# Patient Record
Sex: Female | Born: 1970
Health system: Southern US, Community
[De-identification: ages and names within clinical notes are randomized; demographics above are authoritative.]

## PROBLEM LIST (undated history)

## (undated) DIAGNOSIS — F419 Anxiety disorder, unspecified: Secondary | ICD-10-CM

## (undated) DIAGNOSIS — D649 Anemia, unspecified: Secondary | ICD-10-CM

## (undated) HISTORY — DX: Anxiety disorder, unspecified: F41.9

## (undated) HISTORY — DX: Anemia, unspecified: D64.9

## (undated) HISTORY — PX: TUBAL LIGATION: SHX77

---

## 1999-02-20 ENCOUNTER — Encounter: Payer: Self-pay | Admitting: Emergency Medicine

## 1999-02-20 ENCOUNTER — Emergency Department (HOSPITAL_COMMUNITY): Admission: EM | Admit: 1999-02-20 | Discharge: 1999-02-20 | Payer: Self-pay | Admitting: Emergency Medicine

## 2000-08-27 ENCOUNTER — Encounter: Admission: RE | Admit: 2000-08-27 | Discharge: 2000-08-27 | Payer: Self-pay | Admitting: Family Medicine

## 2000-09-20 ENCOUNTER — Encounter: Admission: RE | Admit: 2000-09-20 | Discharge: 2000-09-20 | Payer: Self-pay | Admitting: Family Medicine

## 2000-09-20 ENCOUNTER — Other Ambulatory Visit: Admission: RE | Admit: 2000-09-20 | Discharge: 2000-09-20 | Payer: Self-pay | Admitting: *Deleted

## 2000-09-27 ENCOUNTER — Ambulatory Visit (HOSPITAL_COMMUNITY): Admission: RE | Admit: 2000-09-27 | Discharge: 2000-09-27 | Payer: Self-pay | Admitting: Family Medicine

## 2000-10-04 ENCOUNTER — Encounter: Admission: RE | Admit: 2000-10-04 | Discharge: 2000-10-04 | Payer: Self-pay | Admitting: Family Medicine

## 2000-12-06 ENCOUNTER — Encounter: Admission: RE | Admit: 2000-12-06 | Discharge: 2000-12-06 | Payer: Self-pay | Admitting: Family Medicine

## 2000-12-07 ENCOUNTER — Encounter (HOSPITAL_COMMUNITY): Admission: RE | Admit: 2000-12-07 | Discharge: 2001-01-25 | Payer: Self-pay | Admitting: Obstetrics & Gynecology

## 2000-12-08 ENCOUNTER — Encounter: Admission: RE | Admit: 2000-12-08 | Discharge: 2000-12-08 | Payer: Self-pay | Admitting: Family Medicine

## 2000-12-09 ENCOUNTER — Encounter: Admission: RE | Admit: 2000-12-09 | Discharge: 2000-12-09 | Payer: Self-pay | Admitting: Family Medicine

## 2000-12-13 ENCOUNTER — Encounter: Admission: RE | Admit: 2000-12-13 | Discharge: 2000-12-13 | Payer: Self-pay | Admitting: Pediatrics

## 2000-12-28 ENCOUNTER — Encounter: Admission: RE | Admit: 2000-12-28 | Discharge: 2000-12-28 | Payer: Self-pay | Admitting: Family Medicine

## 2001-01-06 ENCOUNTER — Inpatient Hospital Stay (HOSPITAL_COMMUNITY): Admission: AD | Admit: 2001-01-06 | Discharge: 2001-01-06 | Payer: Self-pay | Admitting: *Deleted

## 2001-01-12 ENCOUNTER — Encounter: Admission: RE | Admit: 2001-01-12 | Discharge: 2001-01-12 | Payer: Self-pay | Admitting: Family Medicine

## 2001-01-18 ENCOUNTER — Encounter: Admission: RE | Admit: 2001-01-18 | Discharge: 2001-01-18 | Payer: Self-pay | Admitting: Family Medicine

## 2001-01-24 ENCOUNTER — Inpatient Hospital Stay (HOSPITAL_COMMUNITY): Admission: AD | Admit: 2001-01-24 | Discharge: 2001-01-24 | Payer: Self-pay | Admitting: Obstetrics & Gynecology

## 2001-01-24 ENCOUNTER — Inpatient Hospital Stay (HOSPITAL_COMMUNITY): Admission: AD | Admit: 2001-01-24 | Discharge: 2001-01-26 | Payer: Self-pay | Admitting: Obstetrics

## 2002-03-13 ENCOUNTER — Encounter: Payer: Self-pay | Admitting: Obstetrics and Gynecology

## 2002-03-13 ENCOUNTER — Inpatient Hospital Stay (HOSPITAL_COMMUNITY): Admission: AD | Admit: 2002-03-13 | Discharge: 2002-03-13 | Payer: Self-pay | Admitting: Obstetrics and Gynecology

## 2002-09-05 ENCOUNTER — Other Ambulatory Visit: Admission: RE | Admit: 2002-09-05 | Discharge: 2002-09-05 | Payer: Self-pay | Admitting: *Deleted

## 2002-10-26 ENCOUNTER — Inpatient Hospital Stay (HOSPITAL_COMMUNITY): Admission: AD | Admit: 2002-10-26 | Discharge: 2002-10-28 | Payer: Self-pay | Admitting: *Deleted

## 2002-10-27 ENCOUNTER — Encounter (INDEPENDENT_AMBULATORY_CARE_PROVIDER_SITE_OTHER): Payer: Self-pay | Admitting: Specialist

## 2003-04-23 ENCOUNTER — Emergency Department (HOSPITAL_COMMUNITY): Admission: EM | Admit: 2003-04-23 | Discharge: 2003-04-23 | Payer: Self-pay | Admitting: Emergency Medicine

## 2003-08-09 ENCOUNTER — Emergency Department (HOSPITAL_COMMUNITY): Admission: AD | Admit: 2003-08-09 | Discharge: 2003-08-09 | Payer: Self-pay | Admitting: Emergency Medicine

## 2003-08-09 ENCOUNTER — Encounter: Payer: Self-pay | Admitting: Emergency Medicine

## 2004-08-31 ENCOUNTER — Emergency Department (HOSPITAL_COMMUNITY): Admission: EM | Admit: 2004-08-31 | Discharge: 2004-09-01 | Payer: Self-pay | Admitting: Emergency Medicine

## 2005-01-05 IMAGING — CR DG CHEST 2V
2 series · 2 of 2 positions shown · non-contrast
Comparison: none

CLINICAL DATA: Acute onset chest pain.

CHEST - 2 VIEW

[view not recorded (1 of 2)]
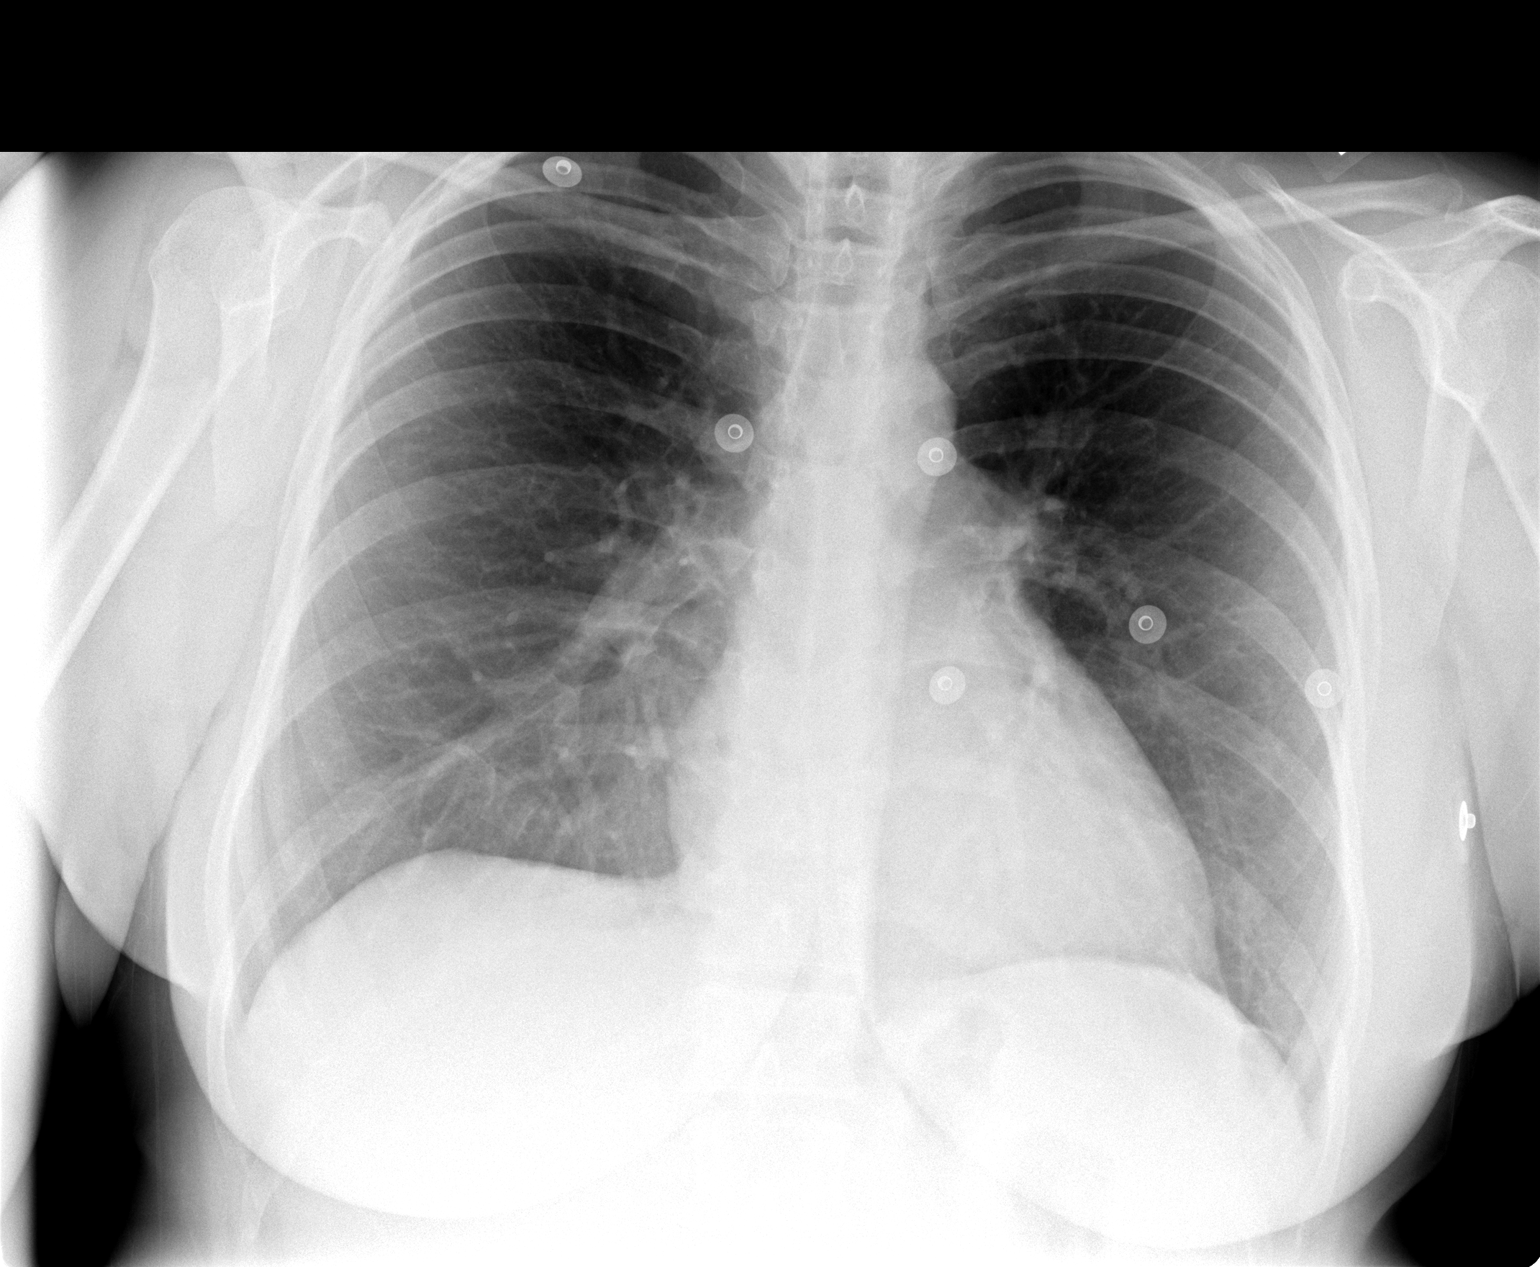

[view not recorded (2 of 2)]
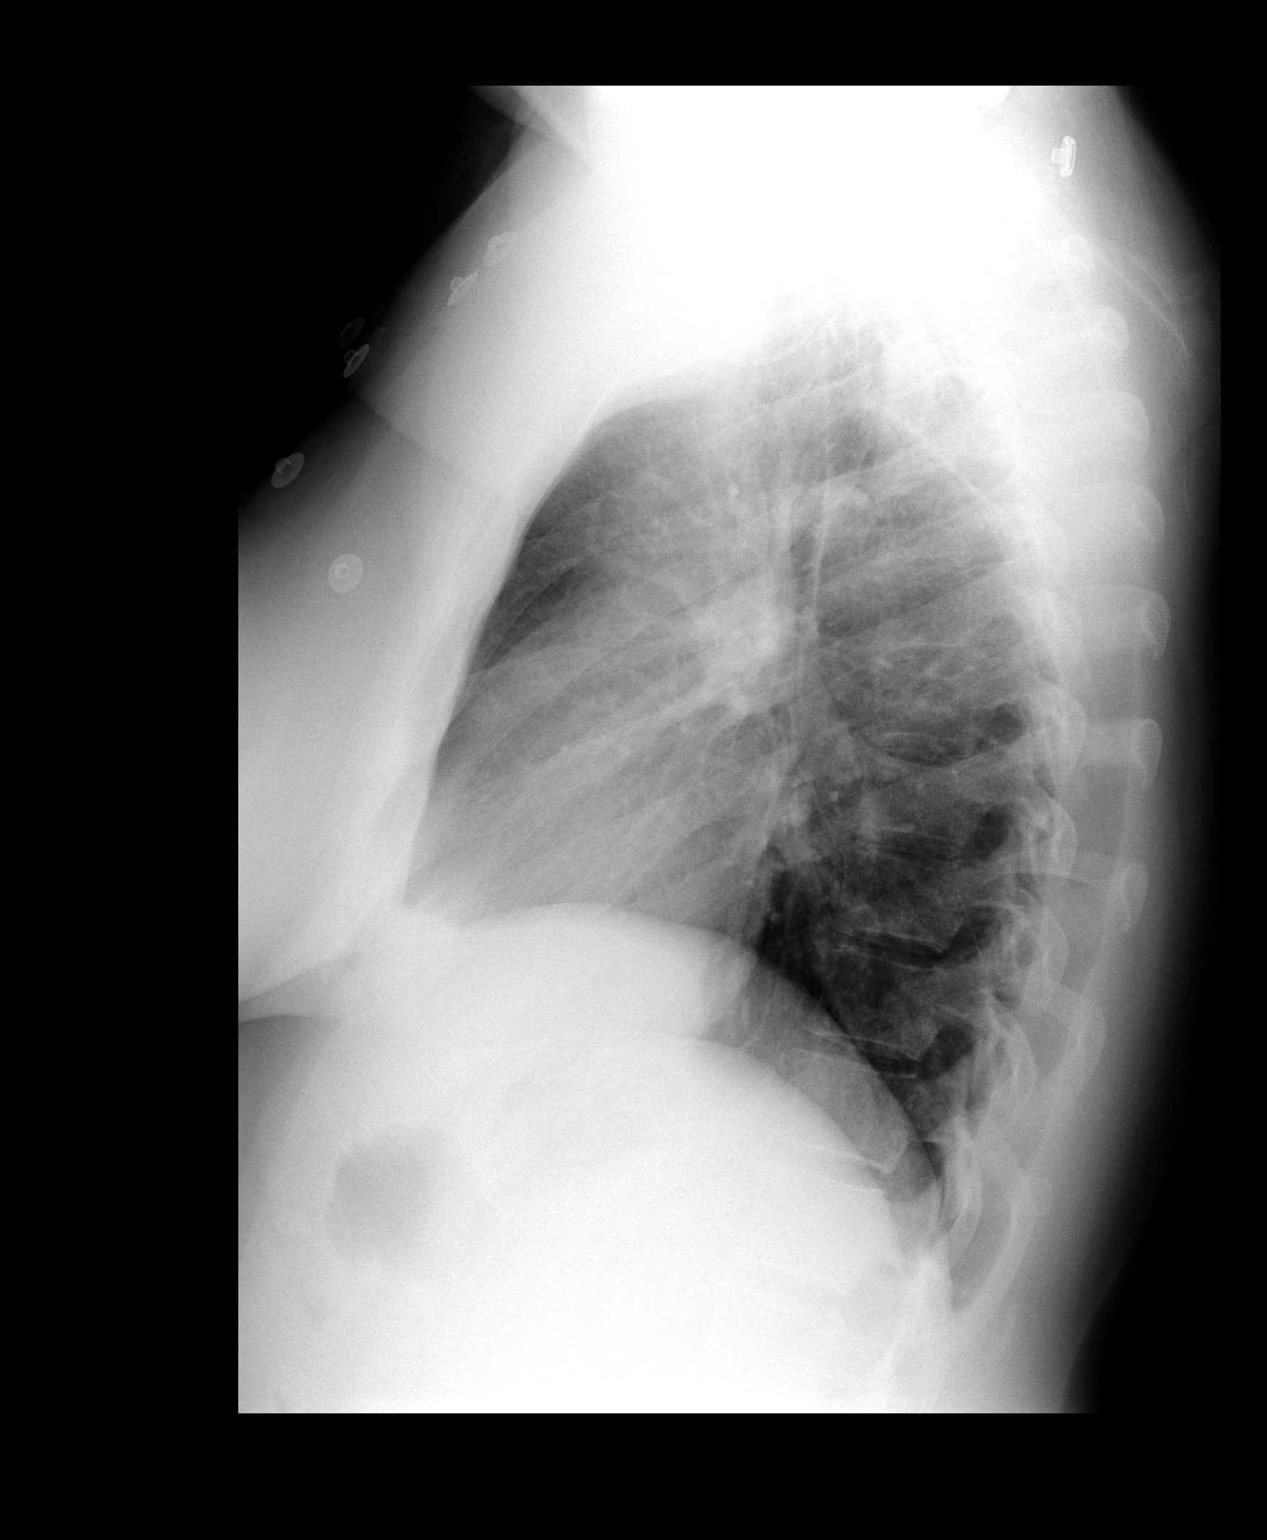

[2 of 2 positions shown; findings below may reference images not displayed]

FINDINGS: The heart size and mediastinal contours are within normal limits.  Both lungs are clear.
The visualized skeletal structures are unremarkable.

IMPRESSION

No active cardiopulmonary disease.

## 2010-11-30 ENCOUNTER — Encounter: Payer: Self-pay | Admitting: Neurology

## 2011-03-27 NOTE — Op Note (Signed)
   NAMESERINITY, WARE                           ACCOUNT NO.:  [id number]   MEDICAL RECORD NO.:  [id number]                   PATIENT TYPE:  INP   LOCATION:  9131                                 FACILITY:  [facility]   PHYSICIAN:  Georgina Peer, M.D.              DATE OF BIRTH:  [birth year]   DATE OF PROCEDURE:  10/27/2002  DATE OF DISCHARGE:  10/28/2002                                 OPERATIVE REPORT   PREOPERATIVE DIAGNOSES:  Postpartum.  Desires elective sterilization.   POSTOPERATIVE DIAGNOSES:  Postpartum.  Desires elective sterilization.   OPERATION PERFORMED:  Postpartum tubal ligation, Pomeroy method.   SURGEON:  Georgina Peer, M.D.   ANESTHESIA:  Epidural plus 10 cc of 0.25% Marcaine in the skin.   BLOOD LOSS:  Less than 20 cc.   FINDINGS:  Normal tubes.   COMPLICATIONS:  None.   INDICATIONS FOR PROCEDURE:  This 40 year old gravida 3, para 3, delivered on  October 26, 2002.  Desired permanent sterilization. She was aware of the  risks and complications of the procedure including bleeding, infection,  intra-abdominal injury, and the failure rate of 3 per 1000 risk.  She is  willing to proceed.  She had executed a consent form for tubal  sterilization.   DESCRIPTION OF PROCEDURE:  The patient had an epidural redosed and was  prepped and draped in a normal sterile fashion.  A subumbilical incision was  made.  The fascia was identified and divided.  The peritoneum was identified  and divided.  The right tube was identified and traced to its fimbriated  end.  A loop was elevated and a knuckle of tube tied with two absorbable  sutures and the midportion excised.  The left tube similarly was identified  and traced to its fimbriated end.  A knuckle in the midportion was elevated.  Two absorbable sutures were tied around this, and the midportion was  excised.  There was no other pathology noted and a limited evaluation.  The  fascia was closed with a Vicryl suture and the  skin with subcuticular Dexon.  Sponge, needle, and instrument counts were correct.  10 cc of 0.25% Marcaine  were injected into the incisional site.  The patient was returned to the  recovery area in a stable condition.                                               Georgina Peer, M.D.    JPN/MEDQ  D:  10/27/2002  T:  10/28/2002  Job:  [id number]

## 2015-12-14 ENCOUNTER — Emergency Department (HOSPITAL_COMMUNITY)
Admission: EM | Admit: 2015-12-14 | Discharge: 2015-12-14 | Disposition: A | Discharge status: Home / Self Care | Payer: Self-pay | Attending: Emergency Medicine | Admitting: Emergency Medicine

## 2015-12-14 ENCOUNTER — Emergency Department (HOSPITAL_COMMUNITY): Payer: Self-pay

## 2015-12-14 ENCOUNTER — Encounter (HOSPITAL_COMMUNITY): Payer: Self-pay | Admitting: Emergency Medicine

## 2015-12-14 DIAGNOSIS — J069 Acute upper respiratory infection, unspecified: Secondary | ICD-10-CM | POA: Insufficient documentation

## 2015-12-14 MED ORDER — GUAIFENESIN 100 MG/5ML PO LIQD: 100.0000 mg | ORAL | Status: DC | PRN

## 2015-12-14 MED ORDER — BENZONATATE 100 MG PO CAPS
200.0000 mg | ORAL_CAPSULE | Freq: Once | ORAL | Status: AC
  Administered 2015-12-14: 200 mg via ORAL
  Filled 2015-12-14: qty 2

## 2015-12-14 MED ORDER — BENZONATATE 100 MG PO CAPS: 200.0000 mg | ORAL_CAPSULE | Freq: Two times a day (BID) | ORAL | Status: DC | PRN

## 2015-12-14 NOTE — ED Provider Notes (Signed)
CSN: [id number]     Arrival date & time 12/14/15  4098 History   First MD Initiated Contact with Patient 12/14/15 [phone]     Chief Complaint  Patient presents with  . URI  . Sore Throat     (Consider location/radiation/quality/duration/timing/severity/associated sxs/prior Treatment) HPI    Erika Lang is a 45 year old female, otherwise healthy, who presents to the emergency room with complaints of cough for 6 days that has gradually worsened, began as throat irritation and has become intermittently productive with yellow sputum. Patient also complains of a sore throat. She has associated sweats which she states usually happen after she takes TheraFlu and ibuprofen. She is unsure if she has had any fever but denies night sweats, shortness of breath, chest tightness, wheeze, orthopnea, inspirational chest pain, lower extremity edema, palpitations.  She has had a few sick contacts at work. She reports being able to go to work all week and feeling relatively well when ibuprofen was working and worse when it wore off. She reports being able to sleep with her cough and having worsening cough upon wakening.  Denies hemoptysis, recent travel. She is a nonsmoker, no past pulmonary history.  She denies any nasal symptoms however has had a fullness in both of her ears.  She states that today she went in to work and felt a little jittery.  She denies lightheadedness, vertigo, shortness of breath, headache, fever, syncope.   History reviewed. No pertinent past medical history. History reviewed. No pertinent past surgical history. No family history on file. Social History  Substance Use Topics  . Smoking status: Never Smoker   . Smokeless tobacco: None  . Alcohol Use: No   OB History    No data available     Review of Systems  Constitutional: Negative for activity change and appetite change.  Eyes: Negative.   Respiratory: Negative for apnea, choking, chest tightness, shortness of breath, wheezing  and stridor.   Cardiovascular: Negative for chest pain, palpitations and leg swelling.  Gastrointestinal: Negative.  Negative for nausea, vomiting and abdominal pain.  Genitourinary: Negative.   Musculoskeletal: Negative.   Skin: Negative.  Negative for rash.  Neurological: Negative.       Allergies  Review of patient's allergies indicates not on file.  Home Medications   Prior to Admission medications   Medication Sig Start Date End Date Taking? Authorizing Provider  DM-Doxylamine-Acetaminophen (NYQUIL COLD & FLU PO) Take 1 capsule by mouth at bedtime as needed (flu symptoms).   Yes Historical Provider, MD  ibuprofen (ADVIL,MOTRIN) 200 MG tablet Take 200 mg by mouth every 6 (six) hours as needed for moderate pain.   Yes Historical Provider, MD  Phenylephrine-Pheniramine-DM [facility] COLD & COUGH) 08-29-19 MG PACK Take 1 packet by mouth every 8 (eight) hours as needed (cold and flu).   Yes Historical Provider, MD  benzonatate (TESSALON) 100 MG capsule Take 2 capsules (200 mg total) by mouth 2 (two) times daily as needed for cough. 12/14/15   Danelle Berry, PA-C  guaiFENesin (ROBITUSSIN) 100 MG/5ML liquid Take 5-10 mLs (100-200 mg total) by mouth every 4 (four) hours as needed for cough. 12/14/15   Danelle Berry, PA-C   BP 125/78 mmHg  Pulse 76  Temp(Src) 97 F (36.1 C) (Oral)  Resp 18  Ht  (1.626 m)  Wt 86.183 kg  BMI 32.60 kg/m2  SpO2 98%  LMP 12/11/2015 (Exact Date) Physical Exam  Constitutional: She is oriented to person, place, and time. She appears well-developed and well-nourished.  No distress.  Well-appearing female, appears stated age, NAD  HENT:  Head: Normocephalic and atraumatic.  Nose: Nose normal.  Mouth/Throat: Oropharynx is clear and moist. No oropharyngeal exudate.  MMM, posterior oropharynx no erythema, no edema, no exudate, bilateral tympanic membranes normal in appearance, translucent pearly gray, no effusion no erythema, no cervical lymphadenopathy  Eyes:  Conjunctivae and EOM are normal. Pupils are equal, round, and reactive to light. Right eye exhibits no discharge. Left eye exhibits no discharge. No scleral icterus.  Neck: Normal range of motion. Neck supple. No JVD present. No tracheal deviation present. No thyromegaly present.  Cardiovascular: Normal rate, regular rhythm, normal heart sounds and intact distal pulses.  Exam reveals no gallop and no friction rub.   No murmur heard. Pulmonary/Chest: Effort normal and breath sounds normal. No respiratory distress. She has no wheezes. She has no rales. She exhibits no tenderness.  Clear to auscultation anteriorly and posteriorly, symmetrical chest expansion, no tenderness to palpation, no wheeze, rales or rhonchi.  Occasional cough and frequent throat clearing  Abdominal: Soft. Bowel sounds are normal. She exhibits no distension and no mass. There is no tenderness. There is no rebound and no guarding.  Musculoskeletal: Normal range of motion. She exhibits no edema or tenderness.  Lymphadenopathy:    She has no cervical adenopathy.  Neurological: She is alert and oriented to person, place, and time. She has normal reflexes. No cranial nerve deficit. She exhibits normal muscle tone. Coordination normal.  Skin: Skin is warm and dry. No rash noted. She is not diaphoretic. No erythema. No pallor.  Normal capillary refill in all extremities, no lower extremity edema, no unilateral swelling or erythema  Psychiatric: She has a normal mood and affect. Her behavior is normal. Judgment and thought content normal.  Nursing note and vitals reviewed.   ED Course  Procedures (including critical care time) Labs Review Labs Reviewed - No data to display  Imaging Review Dg Chest 2 View  12/14/2015  CLINICAL DATA:  Productive cough, congestion for 6 days.  Fever. EXAM: CHEST  2 VIEW COMPARISON:  08/31/2004 FINDINGS: The heart size and mediastinal contours are within normal limits. Both lungs are clear. The  visualized skeletal structures are unremarkable. IMPRESSION: No active cardiopulmonary disease. Electronically Signed   By: Elige Ko   On: 12/14/2015 10:32   I have personally reviewed and evaluated these images and lab results as part of my medical decision-making.   EKG Interpretation None      MDM   Healthy 45 year old female with URI symptoms, sore throat and cough 6 days, gradually worsening The patient is afebrile, without tachypnea, dyspnea or hypoxia.  Her presentation is consistent with likely viral upper respiratory infection.  Patient felt slightly worse today,  With reports of sweats, productive sputum, will obtain chest x-ray to rule out pneumonia  CXR negative.  Pt D/C home in good condition, VSS, with cough syrup, tessalon, encouraged supportive tx with tylenol, ibuprofen and fluids.  Work note provided.     Final diagnoses:  Upper respiratory infection     Danelle Berry, PA-C 12/14/15 1303  Lavera Guise, MD 12/14/15 [phone]

## 2015-12-14 NOTE — Discharge Instructions (Signed)
Upper Respiratory Infection, Adult °Most upper respiratory infections (URIs) are a viral infection of the air passages leading to the lungs. A URI affects the nose, throat, and upper air passages. The most common type of URI is nasopharyngitis and is typically referred to as "the common cold." °URIs run their course and usually go away on their own. Most of the time, a URI does not require medical attention, but sometimes a bacterial infection in the upper airways can follow a viral infection. This is called a secondary infection. Sinus and middle ear infections are common types of secondary upper respiratory infections. °Bacterial pneumonia can also complicate a URI. A URI can worsen asthma and chronic obstructive pulmonary disease (COPD). Sometimes, these complications can require emergency medical care and may be life threatening.  °CAUSES °Almost all URIs are caused by viruses. A virus is a type of germ and can spread from one person to another.  °RISKS FACTORS °You may be at risk for a URI if:  °· You smoke.   °· You have chronic heart or lung disease. °· You have a weakened defense (immune) system.   °· You are very young or very old.   °· You have nasal allergies or asthma. °· You work in crowded or poorly ventilated areas. °· You work in health care facilities or schools. °SIGNS AND SYMPTOMS  °Symptoms typically develop 2-3 days after you come in contact with a cold virus. Most viral URIs last 7-10 days. However, viral URIs from the influenza virus (flu virus) can last 14-18 days and are typically more severe. Symptoms may include:  °· Runny or stuffy (congested) nose.   °· Sneezing.   °· Cough.   °· Sore throat.   °· Headache.   °· Fatigue.   °· Fever.   °· Loss of appetite.   °· Pain in your forehead, behind your eyes, and over your cheekbones (sinus pain). °· Muscle aches.   °DIAGNOSIS  °Your health care provider may diagnose a URI by: °· Physical exam. °· Tests to check that your symptoms are not due to  another condition such as: °· Strep throat. °· Sinusitis. °· Pneumonia. °· Asthma. °TREATMENT  °A URI goes away on its own with time. It cannot be cured with medicines, but medicines may be prescribed or recommended to relieve symptoms. Medicines may help: °· Reduce your fever. °· Reduce your cough. °· Relieve nasal congestion. °HOME CARE INSTRUCTIONS  °· Take medicines only as directed by your health care provider.   °· Gargle warm saltwater or take cough drops to comfort your throat as directed by your health care provider. °· Use a warm mist humidifier or inhale steam from a shower to increase air moisture. This may make it easier to breathe. °· Drink enough fluid to keep your urine clear or pale yellow.   °· Eat soups and other clear broths and maintain good nutrition.   °· Rest as needed.   °· Return to work when your temperature has returned to normal or as your health care provider advises. You may need to stay home longer to avoid infecting others. You can also use a face mask and careful hand washing to prevent spread of the virus. °· Increase the usage of your inhaler if you have asthma.   °· Do not use any tobacco products, including cigarettes, chewing tobacco, or electronic cigarettes. If you need help quitting, ask your health care provider. °PREVENTION  °The best way to protect yourself from getting a cold is to practice good hygiene.  °· Avoid oral or hand contact with people with cold   symptoms.   °· Wash your hands often if contact occurs.   °There is no clear evidence that vitamin C, vitamin E, echinacea, or exercise reduces the chance of developing a cold. However, it is always recommended to get plenty of rest, exercise, and practice good nutrition.  °SEEK MEDICAL CARE IF:  °· You are getting worse rather than better.   °· Your symptoms are not controlled by medicine.   °· You have chills. °· You have worsening shortness of breath. °· You have brown or red mucus. °· You have yellow or brown nasal  discharge. °· You have pain in your face, especially when you bend forward. °· You have a fever. °· You have swollen neck glands. °· You have pain while swallowing. °· You have white areas in the back of your throat. °SEEK IMMEDIATE MEDICAL CARE IF:  °· You have severe or persistent: °¨ Headache. °¨ Ear pain. °¨ Sinus pain. °¨ Chest pain. °· You have chronic lung disease and any of the following: °¨ Wheezing. °¨ Prolonged cough. °¨ Coughing up blood. °¨ A change in your usual mucus. °· You have a stiff neck. °· You have changes in your: °¨ Vision. °¨ Hearing. °¨ Thinking. °¨ Mood. °MAKE SURE YOU:  °· Understand these instructions. °· Will watch your condition. °· Will get help right away if you are not doing well or get worse. °  °This information is not intended to replace advice given to you by your health care provider. Make sure you discuss any questions you have with your health care provider. °  °Document Released: 04/21/2001 Document Revised: 03/12/2015 Document Reviewed: 01/31/2014 °Elsevier Interactive Patient Education ©2016 [employer] ° °Cough, Adult °Coughing is a reflex that clears your throat and your airways. Coughing helps to heal and protect your lungs. It is normal to cough occasionally, but a cough that happens with other symptoms or lasts a long time may be a sign of a condition that needs treatment. A cough may last only 2-3 weeks (acute), or it may last longer than 8 weeks (chronic). °CAUSES °Coughing is commonly caused by: °· Breathing in substances that irritate your lungs. °· A viral or bacterial respiratory infection. °· Allergies. °· Asthma. °· Postnasal drip. °· Smoking. °· Acid backing up from the stomach into the esophagus (gastroesophageal reflux). °· Certain medicines. °· Chronic lung problems, including COPD (or rarely, lung cancer). °· Other medical conditions such as heart failure. °HOME CARE INSTRUCTIONS  °Pay attention to any changes in your symptoms. Take these actions to  help with your discomfort: °· Take medicines only as told by your health care provider. °· If you were prescribed an antibiotic medicine, take it as told by your health care provider. Do not stop taking the antibiotic even if you start to feel better. °· Talk with your health care provider before you take a cough suppressant medicine. °· Drink enough fluid to keep your urine clear or pale yellow. °· If the air is dry, use a cold steam vaporizer or humidifier in your bedroom or your home to help loosen secretions. °· Avoid anything that causes you to cough at work or at home. °· If your cough is worse at night, try sleeping in a semi-upright position. °· Avoid cigarette smoke. If you smoke, quit smoking. If you need help quitting, ask your health care provider. °· Avoid caffeine. °· Avoid alcohol. °· Rest as needed. °SEEK MEDICAL CARE IF:  °· You have new symptoms. °· You cough up pus. °· Your cough   does not get better after 2-3 weeks, or your cough gets worse. °· You cannot control your cough with suppressant medicines and you are losing sleep. °· You develop pain that is getting worse or pain that is not controlled with pain medicines. °· You have a fever. °· You have unexplained weight loss. °· You have night sweats. °SEEK IMMEDIATE MEDICAL CARE IF: °· You cough up blood. °· You have difficulty breathing. °· Your heartbeat is very fast. °  °This information is not intended to replace advice given to you by your health care provider. Make sure you discuss any questions you have with your health care provider. °  °Document Released: 04/24/2011 Document Revised: 07/17/2015 Document Reviewed: 01/02/2015 °Elsevier Interactive Patient Education ©2016 [employer] ° °Viral Infections °A viral infection can be caused by different types of viruses. Most viral infections are not serious and resolve on their own. However, some infections may cause severe symptoms and may lead to further complications. °SYMPTOMS °Viruses can  frequently cause: °· Minor sore throat. °· Aches and pains. °· Headaches. °· Runny nose. °· Different types of rashes. °· Watery eyes. °· Tiredness. °· Cough. °· Loss of appetite. °· Gastrointestinal infections, resulting in nausea, vomiting, and diarrhea. °These symptoms do not respond to antibiotics because the infection is not caused by bacteria. However, you might catch a bacterial infection following the viral infection. This is sometimes called a "superinfection." Symptoms of such a bacterial infection may include: °· Worsening sore throat with pus and difficulty swallowing. °· Swollen neck glands. °· Chills and a high or persistent fever. °· Severe headache. °· Tenderness over the sinuses. °· Persistent overall ill feeling (malaise), muscle aches, and tiredness (fatigue). °· Persistent cough. °· Yellow, green, or brown mucus production with coughing. °HOME CARE INSTRUCTIONS  °· Only take over-the-counter or prescription medicines for pain, discomfort, diarrhea, or fever as directed by your caregiver. °· Drink enough water and fluids to keep your urine clear or pale yellow. Sports drinks can provide valuable electrolytes, sugars, and hydration. °· Get plenty of rest and maintain proper nutrition. Soups and broths with crackers or rice are fine. °SEEK IMMEDIATE MEDICAL CARE IF:  °· You have severe headaches, shortness of breath, chest pain, neck pain, or an unusual rash. °· You have uncontrolled vomiting, diarrhea, or you are unable to keep down fluids. °· You or your child has an oral temperature above 102° F (38.9° C), not controlled by medicine. °· Your baby is older than 3 months with a rectal temperature of 102° F (38.9° C) or higher. °· Your baby is 3 months old or younger with a rectal temperature of 100.4° F (38° C) or higher. °MAKE SURE YOU:  °· Understand these instructions. °· Will watch your condition. °· Will get help right away if you are not doing well or get worse. °  °This information is not  intended to replace advice given to you by your health care provider. Make sure you discuss any questions you have with your health care provider. °  °Document Released: 08/05/2005 Document Revised: 01/18/2012 Document Reviewed: 04/03/2015 °Elsevier Interactive Patient Education ©2016 [employer] ° °

## 2015-12-14 NOTE — ED Notes (Signed)
C/o cold symptoms since Monday-- has been using OTC meds without relief-- took thera flu this am at 8. Nyquil at night.

## 2017-12-24 ENCOUNTER — Other Ambulatory Visit: Payer: Self-pay

## 2017-12-24 ENCOUNTER — Emergency Department (HOSPITAL_COMMUNITY)
Admission: EM | Admit: 2017-12-24 | Discharge: 2017-12-24 | Disposition: A | Discharge status: Home / Self Care | Payer: Self-pay | Attending: Emergency Medicine | Admitting: Emergency Medicine

## 2017-12-24 ENCOUNTER — Emergency Department (HOSPITAL_COMMUNITY): Payer: Self-pay

## 2017-12-24 ENCOUNTER — Encounter (HOSPITAL_COMMUNITY): Payer: Self-pay | Admitting: Emergency Medicine

## 2017-12-24 DIAGNOSIS — R091 Pleurisy: Secondary | ICD-10-CM | POA: Insufficient documentation

## 2017-12-24 DIAGNOSIS — M546 Pain in thoracic spine: Secondary | ICD-10-CM | POA: Insufficient documentation

## 2017-12-24 DIAGNOSIS — E876 Hypokalemia: Secondary | ICD-10-CM | POA: Insufficient documentation

## 2017-12-24 DIAGNOSIS — D649 Anemia, unspecified: Secondary | ICD-10-CM | POA: Insufficient documentation

## 2017-12-24 LAB — I-STAT BETA HCG BLOOD, ED (MC, WL, AP ONLY)

## 2017-12-24 LAB — CBC
HCT: 32.5 % — ABNORMAL LOW (ref 36.0–46.0)
HEMOGLOBIN: 10.8 g/dL — AB (ref 12.0–15.0)
MCH: 29.7 pg (ref 26.0–34.0)
MCHC: 33.2 g/dL (ref 30.0–36.0)
MCV: 89.3 fL (ref 78.0–100.0)
PLATELETS: 316 10*3/uL (ref 150–400)
RBC: 3.64 MIL/uL — AB (ref 3.87–5.11)
RDW: 13.4 % (ref 11.5–15.5)
WBC: 10.9 10*3/uL — AB (ref 4.0–10.5)

## 2017-12-24 LAB — D-DIMER, QUANTITATIVE (NOT AT ARMC): D DIMER QUANT: 0.33 ug{FEU}/mL (ref 0.00–0.50)

## 2017-12-24 LAB — BASIC METABOLIC PANEL
ANION GAP: 12 (ref 5–15)
BUN: 12 mg/dL (ref 6–20)
CO2: 20 mmol/L — AB (ref 22–32)
CREATININE: 0.73 mg/dL (ref 0.44–1.00)
Calcium: 8.5 mg/dL — ABNORMAL LOW (ref 8.9–10.3)
Chloride: 103 mmol/L (ref 101–111)
GFR calc non Af Amer: >60 mL/min (ref >60)
Glucose, Bld: 105 mg/dL — ABNORMAL HIGH (ref 65–99)
POTASSIUM: 3.1 mmol/L — AB (ref 3.5–5.1)
SODIUM: 135 mmol/L (ref 135–145)

## 2017-12-24 LAB — I-STAT TROPONIN, ED: Troponin i, poc: 0 ng/mL (ref 0.00–0.08)

## 2017-12-24 MED ORDER — POTASSIUM CHLORIDE CRYS ER 20 MEQ PO TBCR
40.0000 meq | EXTENDED_RELEASE_TABLET | Freq: Once | ORAL | Status: AC
  Administered 2017-12-24: 40 meq via ORAL
  Filled 2017-12-24: qty 2

## 2017-12-24 NOTE — ED Notes (Signed)
Pt reported she failed to mention earlier a family history of cholecystectomy of her mother and brother.

## 2017-12-24 NOTE — ED Notes (Addendum)
Pt alert and oriented x 4 and is verbally responsive. PT reports that she has pain to her back when she take deep breaths. Pt is not in any apparent distress and reports that her symptoms have improved.

## 2017-12-24 NOTE — ED Provider Notes (Signed)
[facility]-EMERGENCY DEPT Provider Note   CSN: [id number] Arrival date & time: 12/24/17  1313     History   Chief Complaint Chief Complaint  Patient presents with  . Shortness of Breath  . Back Pain    HPI Erika Lang is a 47 y.o. female.  HPI Erika Lang is a 47 y.o. female presents to emergency department complaining of sudden onset of right upper periscapular pain.  Pain started while she was getting off work.  Denies any injuries or strenuous activity.  The pain is worsened with taking deep breath.  States minimal pain if she is not taking deep breaths at this time pain was more severe when it started, subsiding now.  Pain is sharp.  It does not radiate.  No history of the same.  No shortness of breath.  No cough or congestion.  No dizziness or lightheadedness.  No nausea or vomiting.  No abdominal pain.  Took ibuprofen prior to coming in.  History reviewed. No pertinent past medical history.  There are no active problems to display for this patient.   History reviewed. No pertinent surgical history.  OB History    No data available       Home Medications    Prior to Admission medications   Medication Sig Start Date End Date Taking? Authorizing Provider  ibuprofen (ADVIL,MOTRIN) 200 MG tablet Take 200 mg by mouth every 6 (six) hours as needed for moderate pain.   Yes [provider]  benzonatate (TESSALON) 100 MG capsule Take 2 capsules (200 mg total) by mouth 2 (two) times daily as needed for cough. Patient not taking: Reported on 12/24/2017 12/14/15   Danelle Berry, PA-C  guaiFENesin (ROBITUSSIN) 100 MG/5ML liquid Take 5-10 mLs (100-200 mg total) by mouth every 4 (four) hours as needed for cough. Patient not taking: Reported on 12/24/2017 12/14/15   Danelle Berry, PA-C    Family History No family history on file.  Social History Social History   Tobacco Use  . Smoking status: Never Smoker  Substance Use Topics  . Alcohol use: No    . Drug use: No     Allergies   Patient has no known allergies.   Review of Systems Review of Systems  Constitutional: Negative for chills and fever.  Respiratory: Negative for cough, chest tightness and shortness of breath.   Cardiovascular: Negative for chest pain, palpitations and leg swelling.  Gastrointestinal: Negative for abdominal pain, diarrhea, nausea and vomiting.  Genitourinary: Negative for dysuria, flank pain and pelvic pain.  Musculoskeletal: Positive for back pain. Negative for arthralgias, myalgias, neck pain and neck stiffness.  Skin: Negative for rash.  Neurological: Negative for dizziness, weakness and headaches.  All other systems reviewed and are negative.    Physical Exam Updated Vital Signs BP 127/73 (BP Location: Left Arm)   Pulse 69   Temp 98.7 F (37.1 C) (Oral)   Resp 18   Ht 5\' 4"  (1.626 m)   Wt 83.9 kg (185 lb)   LMP 12/24/2017   SpO2 100%   BMI 31.76 kg/m   Physical Exam  Constitutional: She is oriented to person, place, and time. She appears well-developed and well-nourished. No distress.  HENT:  Head: Normocephalic.  Eyes: Conjunctivae are normal.  Neck: Neck supple.  Cardiovascular: Normal rate, regular rhythm and normal heart sounds.  Pulmonary/Chest: Effort normal and breath sounds normal. No respiratory distress. She has no wheezes. She has no rales. She exhibits no tenderness.  Abdominal: Soft. Bowel  sounds are normal. She exhibits no distension. There is no tenderness. There is no rebound.  Musculoskeletal: She exhibits no edema.  Neurological: She is alert and oriented to person, place, and time.  Skin: Skin is warm and dry.  Psychiatric: She has a normal mood and affect. Her behavior is normal.  Nursing note and vitals reviewed.    ED Treatments / Results  Labs (all labs ordered are listed, but only abnormal results are displayed) Labs Reviewed  BASIC METABOLIC PANEL - Abnormal; Notable for the following components:       Result Value   Potassium 3.1 (*)    CO2 20 (*)    Glucose, Bld 105 (*)    Calcium 8.5 (*)    All other components within normal limits  CBC - Abnormal; Notable for the following components:   WBC 10.9 (*)    RBC 3.64 (*)    Hemoglobin 10.8 (*)    HCT 32.5 (*)    All other components within normal limits  D-DIMER, QUANTITATIVE (NOT AT [facility])  I-STAT TROPONIN, ED  I-STAT BETA HCG BLOOD, ED ([facility], [facility], [facility] ONLY)    EKG  EKG Interpretation  Date/Time:  Friday December 24 2017 13:20:13 EST Ventricular Rate:  99 PR Interval:    QRS Duration: 91 QT Interval:  345 QTC Calculation: 443 R Axis:   61 Text Interpretation:  Sinus rhythm Probable left atrial enlargement Low voltage, precordial leads Borderline repolarization abnormality No significant change since last tracing Confirmed by Richardean Canal [phone]) on 12/24/2017 8:11:02 PM       Radiology Dg Chest 2 View  Result Date: 12/24/2017 CLINICAL DATA:  Chest pain EXAM: CHEST  2 VIEW COMPARISON:  12/14/2015 FINDINGS: The heart size and mediastinal contours are within normal limits. Both lungs are clear. The visualized skeletal structures are unremarkable. IMPRESSION: No active cardiopulmonary disease. Electronically Signed   By: Kennith Center M.D.   On: 12/24/2017 13:44    Procedures Procedures (including critical care time)  Medications Ordered in ED Medications  potassium chloride SA (K-DUR,KLOR-CON) CR tablet 40 mEq (not administered)     Initial Impression / Assessment and Plan / ED Course  I have reviewed the triage vital signs and the nursing notes.  Pertinent labs & imaging results that were available during my care of the patient were reviewed by me and considered in my medical decision making (see chart for details).     Pt in ED with pleuritic right periscapular pain. No SOB. No dizziness or lightheadiness. No CP. No abdominal pain. Cannot reproduce pain with movement or palpation, only deep breathing.  Patient is  mildly tachycardic, but states she is anxious.  Will check labs including troponin, d-dimer.  8:11 PM Pain right now practically resolved.  Her d-dimer is negative.  Troponin is negative.  Patient is anemic, history of the same, advised to restart iron.  Low potassium.  40 mEq given in emergency department, advised to eat potassium rich foods.  Will start NSAIDs for her pleuritic pain.  We will have her follow-up with family doctor as needed.  At this time doubt PE, low risk and very atypical presentation for ACS.  Question musculoskeletal pain versus pleurisy.  Return precautions discussed  Vitals:   12/24/17 1320 12/24/17 1618 12/24/17 1927  BP: (!) 143/83 135/89 127/73  Pulse: 100 (!) 103 69  Resp: (!) 21 18 18   Temp: 98.6 F (37 C) 98.7 F (37.1 C)   TempSrc: Oral Oral   SpO2:  100% 100% 100%  Weight: 83.9 kg (185 lb)    Height: 5\' 4"  (1.626 m)       Final Clinical Impressions(s) / ED Diagnoses   Final diagnoses:  Acute right-sided thoracic back pain  Pleurisy  Anemia, unspecified type  Hypokalemia    ED Discharge Orders    None       Jaynie CrumbleKirichenko, Mylen Mangan, PA-C 12/24/17 2012    Charlynne PanderYao, David Hsienta, MD 12/27/17 [phone]

## 2017-12-24 NOTE — Discharge Instructions (Signed)
Ibuprofen or naprosyn for pain. Avoid strenuous activity. Restart iron. Eat potassium rich foods.

## 2017-12-24 NOTE — ED Triage Notes (Signed)
Pt complaint of upper back pain and SOB with deep breath onset an hour ago. Denies other.

## 2019-11-07 ENCOUNTER — Other Ambulatory Visit: Payer: Self-pay

## 2019-11-07 ENCOUNTER — Encounter (HOSPITAL_COMMUNITY): Payer: Self-pay

## 2019-11-07 ENCOUNTER — Emergency Department (HOSPITAL_COMMUNITY)
Admission: EM | Admit: 2019-11-07 | Discharge: 2019-11-07 | Disposition: A | Discharge status: Home / Self Care | Payer: Self-pay | Attending: Emergency Medicine | Admitting: Emergency Medicine

## 2019-11-07 DIAGNOSIS — R55 Syncope and collapse: Secondary | ICD-10-CM | POA: Insufficient documentation

## 2019-11-07 DIAGNOSIS — H538 Other visual disturbances: Secondary | ICD-10-CM | POA: Insufficient documentation

## 2019-11-07 LAB — BASIC METABOLIC PANEL
Anion gap: 7 (ref 5–15)
BUN: 13 mg/dL (ref 6–20)
CO2: 25 mmol/L (ref 22–32)
Calcium: 8.6 mg/dL — ABNORMAL LOW (ref 8.9–10.3)
Chloride: 106 mmol/L (ref 98–111)
Creatinine, Ser: 0.77 mg/dL (ref 0.44–1.00)
GFR calc Af Amer: >60 mL/min (ref >60)
GFR calc non Af Amer: >60 mL/min (ref >60)
Glucose, Bld: 99 mg/dL (ref 70–99)
Potassium: 3.7 mmol/L (ref 3.5–5.1)
Sodium: 138 mmol/L (ref 135–145)

## 2019-11-07 LAB — I-STAT BETA HCG BLOOD, ED (MC, WL, AP ONLY): I-stat hCG, quantitative: <5 m[IU]/mL (ref <5)

## 2019-11-07 LAB — CBC
HCT: 33.6 % — ABNORMAL LOW (ref 36.0–46.0)
Hemoglobin: 10.4 g/dL — ABNORMAL LOW (ref 12.0–15.0)
MCH: 26.3 pg (ref 26.0–34.0)
MCHC: 31 g/dL (ref 30.0–36.0)
MCV: 85.1 fL (ref 80.0–100.0)
Platelets: 350 10*3/uL (ref 150–400)
RBC: 3.95 MIL/uL (ref 3.87–5.11)
RDW: 15.1 % (ref 11.5–15.5)
WBC: 9.5 10*3/uL (ref 4.0–10.5)
nRBC: 0 % (ref 0.0–0.2)

## 2019-11-07 LAB — CBG MONITORING, ED: Glucose-Capillary: 83 mg/dL (ref 70–99)

## 2019-11-07 MED ORDER — SODIUM CHLORIDE 0.9% FLUSH: 3.0000 mL | Freq: Once | INTRAVENOUS | Status: DC

## 2019-11-07 NOTE — ED Provider Notes (Signed)
[facility]-EMERGENCY DEPT [facility] Emergency Department Provider Note MRN:  [id number]  Arrival date & time: 11/07/19     Chief Complaint   Blurred Vision and Near Syncope   History of Present Illness   Erika Lang is a 48 y.o. year-old female with no pertinent past medical history presenting to the ED with chief complaint of blurred vision and near syncope.  2 days ago patient was rushing to get ready for work, ran up the stairs, was getting ready when she experienced sudden onset of blurred vision to both eyes.  Blurred vision lasting a few moments and then resolving.  After which, this made her very anxious, and she experienced numbing sensation to her upper and lower lips bilaterally.  This quickly resolved as well.  Has been feeling intermittently lightheaded at work, worse when standing from a seated position.  Denies headache, no vomiting, no chest pain, shortness of breath, no abdominal pain, no numbness or weakness to the arms or legs.  Review of Systems  A complete 10 system review of systems was obtained and all systems are negative except as noted in the HPI and PMH.   Patient's Health History   History reviewed. No pertinent past medical history.  History reviewed. No pertinent surgical history.  History reviewed. No pertinent family history.  Social History   Socioeconomic History  . Marital status: Single    Spouse name: Not on file  . Number of children: Not on file  . Years of education: Not on file  . Highest education level: Not on file  Occupational History  . Not on file  Tobacco Use  . Smoking status: Never Smoker  Substance and Sexual Activity  . Alcohol use: No  . Drug use: No  . Sexual activity: Not on file  Other Topics Concern  . Not on file  Social History Narrative  . Not on file   Social Determinants of Health   Financial Resource Strain:   . Difficulty of Paying Living Expenses: Not on file  Food Insecurity:   . Worried About Community education officer in the Last Year: Not on file  . Ran Out of Food in the Last Year: Not on file  Transportation Needs:   . Lack of Transportation (Medical): Not on file  . Lack of Transportation (Non-Medical): Not on file  Physical Activity:   . Days of Exercise per Week: Not on file  . Minutes of Exercise per Session: Not on file  Stress:   . Feeling of Stress : Not on file  Social Connections:   . Frequency of Communication with Friends and Family: Not on file  . Frequency of Social Gatherings with Friends and Family: Not on file  . Attends Religious Services: Not on file  . Active Member of Clubs or Organizations: Not on file  . Attends Banker Meetings: Not on file  . Marital Status: Not on file  Intimate Partner Violence:   . Fear of Current or Ex-Partner: Not on file  . Emotionally Abused: Not on file  . Physically Abused: Not on file  . Sexually Abused: Not on file     Physical Exam  Vital Signs and Nursing Notes reviewed Vitals:   11/07/19 1538 11/07/19 2006  BP: (!) 157/79 (!) 149/78  Pulse: (!) 109 98  Resp: 16 15  Temp: 97.8 F (36.6 C)   SpO2: 100% 100%    CONSTITUTIONAL: Well-appearing, NAD NEURO:  Alert and oriented x 3, normal and  symmetric strength and sensation, normal coordination with foot shin and finger-nose-finger testing, normal speech, no facial droop, no visual field cuts, no aphasia, no neglect EYES:  eyes equal and reactive ENT/NECK:  no LAD, no JVD CARDIO: Regular rate, well-perfused, normal S1 and S2 PULM:  CTAB no wheezing or rhonchi GI/GU:  normal bowel sounds, non-distended, non-tender MSK/SPINE:  No gross deformities, no edema SKIN:  no rash, atraumatic PSYCH:  Appropriate speech and behavior  Diagnostic and Interventional Summary    EKG Interpretation  Date/Time:  Tuesday November 07 2019 15:44:42 EST Ventricular Rate:  94 PR Interval:    QRS Duration: 94 QT Interval:  359 QTC Calculation: 449 R Axis:   73 Text  Interpretation: Sinus rhythm Borderline T abnormalities, anterior leads Confirmed by Gerlene Fee [phone]) on 11/07/2019 9:13:51 PM      Labs Reviewed  BASIC METABOLIC PANEL - Abnormal; Notable for the following components:      Result Value   Calcium 8.6 (*)    All other components within normal limits  CBC - Abnormal; Notable for the following components:   Hemoglobin 10.4 (*)    HCT 33.6 (*)    All other components within normal limits  URINALYSIS, ROUTINE W REFLEX MICROSCOPIC  CBG MONITORING, ED  I-STAT BETA HCG BLOOD, ED ([facility], [facility], [facility] ONLY)    No orders to display    Medications  sodium chloride flush (NS) 0.9 % injection 3 mL (has no administration in time range)     Procedures  /  Critical Care Procedures  ED Course and Medical Decision Making  I have reviewed the triage vital signs and the nursing notes.  Pertinent labs & imaging results that were available during my care of the patient were reviewed by me and considered in my medical decision making (see below for details).     Favoring orthostatic hypotension explaining her transient blurry vision and intermittent lightheadedness.  Work-up is reassuring, very thorough neurological exam is completely normal.  Perioral numbness crosses the midline and is favored to be related to hyperventilation in the setting of anxiety.  No indication for further testing or imaging, patient is appropriate for discharge with strict return precautions.    Barth Kirks. Sedonia Small, [place] [email or web address]  Final Clinical Impressions(s) / ED Diagnoses     ICD-10-CM   1. Blurred vision, bilateral  H53.8   2. Syncope, unspecified syncope type  R55     ED Discharge Orders    None       Discharge Instructions Discussed with and Provided to Patient:     Discharge Instructions     You were evaluated in the Emergency Department and after careful evaluation, we did not find any  emergent condition requiring admission or further testing in the hospital.  Your exam/testing today was overall reassuring.  Please return to the Emergency Department if you experience any worsening of your condition.  We encourage you to follow up with a primary care provider.  Thank you for allowing [place] to be a part of your care.       Maudie Flakes, MD 11/07/19 2139

## 2019-11-07 NOTE — Discharge Instructions (Addendum)
You were evaluated in the Emergency Department and after careful evaluation, we did not find any emergent condition requiring admission or further testing in the hospital. ° °Your exam/testing today was overall reassuring. ° °Please return to the Emergency Department if you experience any worsening of your condition.  We encourage you to follow up with a primary care provider.  Thank you for allowing us to be a part of your care. ° °

## 2019-11-07 NOTE — ED Triage Notes (Addendum)
Pt states that starting a few days ago, she had blurred vision and a sharp pain behind her left shoulder blade. Pt states she also has numbness in her tongue. 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

## 2020-04-03 ENCOUNTER — Encounter (HOSPITAL_COMMUNITY): Payer: Self-pay

## 2020-04-03 ENCOUNTER — Emergency Department (HOSPITAL_COMMUNITY)
Admission: EM | Admit: 2020-04-03 | Discharge: 2020-04-04 | Disposition: A | Discharge status: Home / Self Care | Payer: Self-pay | Attending: Emergency Medicine | Admitting: Emergency Medicine

## 2020-04-03 ENCOUNTER — Other Ambulatory Visit: Payer: Self-pay

## 2020-04-03 DIAGNOSIS — R55 Syncope and collapse: Secondary | ICD-10-CM | POA: Insufficient documentation

## 2020-04-03 DIAGNOSIS — Z5321 Procedure and treatment not carried out due to patient leaving prior to being seen by health care provider: Secondary | ICD-10-CM | POA: Insufficient documentation

## 2020-04-03 LAB — I-STAT BETA HCG BLOOD, ED (MC, WL, AP ONLY): I-stat hCG, quantitative: <5 m[IU]/mL (ref <5)

## 2020-04-03 LAB — BASIC METABOLIC PANEL
Anion gap: 8 (ref 5–15)
BUN: 11 mg/dL (ref 6–20)
CO2: 23 mmol/L (ref 22–32)
Calcium: 9 mg/dL (ref 8.9–10.3)
Chloride: 104 mmol/L (ref 98–111)
Creatinine, Ser: 0.78 mg/dL (ref 0.44–1.00)
GFR calc Af Amer: >60 mL/min (ref >60)
GFR calc non Af Amer: >60 mL/min (ref >60)
Glucose, Bld: 110 mg/dL — ABNORMAL HIGH (ref 70–99)
Potassium: 3.6 mmol/L (ref 3.5–5.1)
Sodium: 135 mmol/L (ref 135–145)

## 2020-04-03 LAB — CBC
HCT: 36.5 % (ref 36.0–46.0)
Hemoglobin: 11 g/dL — ABNORMAL LOW (ref 12.0–15.0)
MCH: 24.8 pg — ABNORMAL LOW (ref 26.0–34.0)
MCHC: 30.1 g/dL (ref 30.0–36.0)
MCV: 82.2 fL (ref 80.0–100.0)
Platelets: 428 10*3/uL — ABNORMAL HIGH (ref 150–400)
RBC: 4.44 MIL/uL (ref 3.87–5.11)
RDW: 15.7 % — ABNORMAL HIGH (ref 11.5–15.5)
WBC: 8.6 10*3/uL (ref 4.0–10.5)
nRBC: 0 % (ref 0.0–0.2)

## 2020-04-03 MED ORDER — SODIUM CHLORIDE 0.9% FLUSH: 3.0000 mL | Freq: Once | INTRAVENOUS | Status: DC

## 2020-04-03 NOTE — ED Notes (Signed)
No answer x2 and not visible in lobby. Registration states patient left awhile ago

## 2020-04-03 NOTE — ED Triage Notes (Signed)
Patient complains of near syncope almost daily x 2-3 months, has been seen for same with no diagnosis. Denies pain, alert and oriented

## 2020-05-09 ENCOUNTER — Telehealth: Payer: BLUE CROSS/BLUE SHIELD | Admitting: Nurse Practitioner

## 2020-05-09 ENCOUNTER — Ambulatory Visit
Admission: EM | Admit: 2020-05-09 | Discharge: 2020-05-09 | Disposition: A | Discharge status: Home / Self Care | Payer: BLUE CROSS/BLUE SHIELD

## 2020-05-09 ENCOUNTER — Telehealth: Payer: Self-pay | Admitting: Emergency Medicine

## 2020-05-09 DIAGNOSIS — H60331 Swimmer's ear, right ear: Secondary | ICD-10-CM | POA: Diagnosis not present

## 2020-05-09 MED ORDER — NEOMYCIN-POLYMYXIN-HC 3.5-10000-1 OT SOLN: 3.0000 [drp] | Freq: Four times a day (QID) | OTIC | 0 refills | Status: DC

## 2020-05-09 NOTE — Progress Notes (Signed)
E Visit for Swimmer's Ear  We are sorry that you are not feeling well. Here is how we plan to help!  I have prescribed: Neomycin 0.35%, polymyxin B 10,000 units/mL, and hydrocortisone 0,5% otic solution 4 drops in affected ears four times a day for 7 days    In certain cases swimmer's ear may progress to a more serious bacterial infection of the middle or inner ear.  If you have a fever 102 and up and significantly worsening symptoms, this could indicate a more serious infection moving to the middle/inner and needs face to face evaluation in an office by a provider.  Your symptoms should improve over the next 3 days and should resolve in about 7 days.  HOME CARE:   Wash your hands frequently.  Do not place the tip of the bottle on your ear or touch it with your fingers.  You can take Acetominophen 650 mg every 4-6 hours as needed for pain.  If pain is severe or moderate, you can apply a heating pad (set on low) or hot water bottle (wrapped in a towel) to outer ear for 20 minutes.  This will also increase drainage.  Avoid ear plugs  Do not use Q-tips  After showers, help the water run out by tilting your head to one side.  GET HELP RIGHT AWAY IF:   Fever is over 102.2 degrees.  You develop progressive ear pain or hearing loss.  Ear symptoms persist longer than 3 days after treatment.  MAKE SURE YOU:   Understand these instructions.  Will watch your condition.  Will get help right away if you are not doing well or get worse.  TO PREVENT SWIMMER'S EAR:  Use a bathing cap or custom fitted swim molds to keep your ears dry.  Towel off after swimming to dry your ears.  Tilt your head or pull your earlobes to allow the water to escape your ear canal.  If there is still water in your ears, consider using a hairdryer on the lowest setting.  Thank you for choosing an e-visit. Your e-visit answers were reviewed by a board certified advanced clinical practitioner to  complete your personal care plan. Depending upon the condition, your plan could have included both over the counter or prescription medications. Please review your pharmacy choice. Be sure that the pharmacy you have chosen is open so that you can pick up your prescription now.  If there is a problem you may message your provider in MyChart to have the prescription routed to another pharmacy. Your safety is important to us. If you have drug allergies check your prescription carefully.  For the next 24 hours, you can use MyChart to ask questions about today's visit, request a non-urgent call back, or ask for a work or school excuse from your e-visit provider. You will get an email in the next two days asking about your experience. I hope that your e-visit has been valuable and will speed your recovery.   5-10 minutes spent reviewing and documenting in chart.     

## 2020-05-09 NOTE — ED Notes (Signed)
Patient had an evisit earlier today.  Medications were sent to a pharmacy that is closed.  Patient needing medication.  Offered to reroute medication to an open pharmacy.  Patient agreed.  Linward Headland, PA, provider in clinic today agreed with plan

## 2020-05-10 ENCOUNTER — Encounter (HOSPITAL_COMMUNITY): Payer: Self-pay | Admitting: Emergency Medicine

## 2020-05-10 ENCOUNTER — Emergency Department (HOSPITAL_COMMUNITY)
Admission: EM | Admit: 2020-05-10 | Discharge: 2020-05-10 | Disposition: A | Discharge status: Home / Self Care | Payer: BLUE CROSS/BLUE SHIELD | Attending: Emergency Medicine | Admitting: Emergency Medicine

## 2020-05-10 DIAGNOSIS — H60502 Unspecified acute noninfective otitis externa, left ear: Secondary | ICD-10-CM | POA: Insufficient documentation

## 2020-05-10 DIAGNOSIS — H6092 Unspecified otitis externa, left ear: Secondary | ICD-10-CM | POA: Diagnosis not present

## 2020-05-10 DIAGNOSIS — H9202 Otalgia, left ear: Secondary | ICD-10-CM | POA: Diagnosis not present

## 2020-05-10 MED ORDER — CEPHALEXIN 500 MG PO CAPS: 500.0000 mg | ORAL_CAPSULE | Freq: Four times a day (QID) | ORAL | 0 refills | Status: DC

## 2020-05-10 NOTE — ED Provider Notes (Signed)
[facility] EMERGENCY DEPARTMENT Provider Note   CSN: [id number] Arrival date & time: 05/10/20  0444     History Chief Complaint  Patient presents with  . Otalgia    Erika Lang is a 49 y.o. female.  Patient presents to the emergency department with a chief complaint of left-sided ear pain.  She states that the pain started a couple of days ago.  She has been using antibiotic eardrops.  She states that she has had some swelling of the external ear.  She wanted to be reassessed today.  She denies any fevers.  She denies history of diabetes.  Denies swimming recently.  Denies any other associated symptoms.  The history is provided by the patient. No language interpreter was used.       History reviewed. No pertinent past medical history.  There are no problems to display for this patient.   History reviewed. No pertinent surgical history.   OB History   No obstetric history on file.     No family history on file.  Social History   Tobacco Use  . Smoking status: Never Smoker  . Smokeless tobacco: Never Used  Substance Use Topics  . Alcohol use: No  . Drug use: No    Home Medications Prior to Admission medications   Medication Sig Start Date End Date Taking? Authorizing Provider  benzonatate (TESSALON) 100 MG capsule Take 2 capsules (200 mg total) by mouth 2 (two) times daily as needed for cough. Patient not taking: Reported on 12/24/2017 12/14/15   Danelle Berry, PA-C  cephALEXin (KEFLEX) 500 MG capsule Take 1 capsule (500 mg total) by mouth 4 (four) times daily. 05/10/20   Roxy Horseman, PA-C  guaiFENesin (ROBITUSSIN) 100 MG/5ML liquid Take 5-10 mLs (100-200 mg total) by mouth every 4 (four) hours as needed for cough. Patient not taking: Reported on 12/24/2017 12/14/15   Danelle Berry, PA-C  ibuprofen (ADVIL,MOTRIN) 200 MG tablet Take 200 mg by mouth every 6 (six) hours as needed for moderate pain.    [provider]    neomycin-polymyxin-hydrocortisone (CORTISPORIN) OTIC solution Place 3 drops into both ears 4 (four) times daily. 05/09/20   Bennie Pierini, FNP    Allergies    Patient has no known allergies.  Review of Systems   Review of Systems  All other systems reviewed and are negative.   Physical Exam Updated Vital Signs BP 133/83 (BP Location: Right Arm)   Pulse (!) 102   Temp 98.4 F (36.9 C) (Oral)   Resp 18   SpO2 100%   Physical Exam Vitals and nursing note reviewed.  Constitutional:      General: She is not in acute distress.    Appearance: She is well-developed.  HENT:     Head: Normocephalic and atraumatic.     Ears:     Comments: Mild erythema, edema, and trace debris in the left ear canal with mild erythema of the left tragus Eyes:     Conjunctiva/sclera: Conjunctivae normal.  Cardiovascular:     Rate and Rhythm: Normal rate.     Heart sounds: No murmur heard.   Pulmonary:     Effort: Pulmonary effort is normal. No respiratory distress.  Abdominal:     General: There is no distension.  Musculoskeletal:     Cervical back: Neck supple.     Comments: Moves all extremities  Skin:    General: Skin is warm and dry.  Neurological:     Mental Status: She is  alert and oriented to person, place, and time.  Psychiatric:        Mood and Affect: Mood normal.        Behavior: Behavior normal.     ED Results / Procedures / Treatments   Labs (all labs ordered are listed, but only abnormal results are displayed) Labs Reviewed - No data to display  EKG None  Radiology No results found.  Procedures Procedures (including critical care time)  Medications Ordered in ED Medications - No data to display  ED Course  I have reviewed the triage vital signs and the nursing notes.  Pertinent labs & imaging results that were available during my care of the patient were reviewed by me and considered in my medical decision making (see chart for details).    MDM  Rules/Calculators/A&P                          Patient with physical exam findings consistent with otitis externa of the left ear with possible early/mild cellulitis of the left tragus.  Will cover with Keflex.  Recommend continuing the eardrops as prescribed yesterday during an e visit.  I also went over the patient's most recent labs from May per her request.  She had questions regarding her low hemoglobin level.  It looks like she has had low hemoglobin for the past couple of years at least.  Advised her to follow-up with her doctor. Final Clinical Impression(s) / ED Diagnoses Final diagnoses:  Acute otitis externa of left ear, unspecified type    Rx / DC Orders ED Discharge Orders         Ordered    cephALEXin (KEFLEX) 500 MG capsule  4 times daily     Discontinue  Reprint     05/10/20 0622           Roxy Horseman, PA-C 05/10/20 9371    Zadie Rhine, MD 05/11/20 [phone]

## 2020-05-10 NOTE — ED Triage Notes (Signed)
Pt c/o L ear pain, worse with touch. Pt seen by evisit yesterday and has started ear drops but has worse pain now.  Pt also would like to have someone go over labs results for last visit

## 2020-05-21 ENCOUNTER — Emergency Department (HOSPITAL_COMMUNITY)
Admission: EM | Admit: 2020-05-21 | Discharge: 2020-05-21 | Disposition: A | Discharge status: Home / Self Care | Payer: BLUE CROSS/BLUE SHIELD | Attending: Emergency Medicine | Admitting: Emergency Medicine

## 2020-05-21 ENCOUNTER — Encounter (HOSPITAL_COMMUNITY): Payer: Self-pay

## 2020-05-21 ENCOUNTER — Other Ambulatory Visit: Payer: Self-pay

## 2020-05-21 DIAGNOSIS — R42 Dizziness and giddiness: Secondary | ICD-10-CM | POA: Diagnosis not present

## 2020-05-21 DIAGNOSIS — R55 Syncope and collapse: Secondary | ICD-10-CM | POA: Insufficient documentation

## 2020-05-21 LAB — BASIC METABOLIC PANEL
Anion gap: 11 (ref 5–15)
BUN: 10 mg/dL (ref 6–20)
CO2: 24 mmol/L (ref 22–32)
Calcium: 9 mg/dL (ref 8.9–10.3)
Chloride: 103 mmol/L (ref 98–111)
Creatinine, Ser: 0.72 mg/dL (ref 0.44–1.00)
GFR calc Af Amer: >60 mL/min (ref >60)
GFR calc non Af Amer: >60 mL/min (ref >60)
Glucose, Bld: 98 mg/dL (ref 70–99)
Potassium: 3.6 mmol/L (ref 3.5–5.1)
Sodium: 138 mmol/L (ref 135–145)

## 2020-05-21 LAB — URINALYSIS, ROUTINE W REFLEX MICROSCOPIC
Bilirubin Urine: NEGATIVE
Glucose, UA: NEGATIVE mg/dL
Ketones, ur: 5 mg/dL — AB
Nitrite: NEGATIVE
Protein, ur: 30 mg/dL — AB
RBC / HPF: >50 RBC/hpf — ABNORMAL HIGH (ref 0–5)
Specific Gravity, Urine: 1.024 (ref 1.005–1.030)
pH: 5 (ref 5.0–8.0)

## 2020-05-21 LAB — CBC
HCT: 35.4 % — ABNORMAL LOW (ref 36.0–46.0)
Hemoglobin: 10.9 g/dL — ABNORMAL LOW (ref 12.0–15.0)
MCH: 26.4 pg (ref 26.0–34.0)
MCHC: 30.8 g/dL (ref 30.0–36.0)
MCV: 85.7 fL (ref 80.0–100.0)
Platelets: 347 10*3/uL (ref 150–400)
RBC: 4.13 MIL/uL (ref 3.87–5.11)
RDW: 17.5 % — ABNORMAL HIGH (ref 11.5–15.5)
WBC: 7.3 10*3/uL (ref 4.0–10.5)
nRBC: 0 % (ref 0.0–0.2)

## 2020-05-21 LAB — I-STAT BETA HCG BLOOD, ED (MC, WL, AP ONLY): I-stat hCG, quantitative: <5 m[IU]/mL (ref <5)

## 2020-05-21 LAB — CBG MONITORING, ED: Glucose-Capillary: 101 mg/dL — ABNORMAL HIGH (ref 70–99)

## 2020-05-21 MED ORDER — MECLIZINE HCL 25 MG PO TABS
25.0000 mg | ORAL_TABLET | Freq: Once | ORAL | Status: AC
  Administered 2020-05-21: 25 mg via ORAL
  Filled 2020-05-21: qty 1

## 2020-05-21 MED ORDER — SODIUM CHLORIDE 0.9 % IV BOLUS
1000.0000 mL | Freq: Once | INTRAVENOUS | Status: AC
  Administered 2020-05-21: 1000 mL via INTRAVENOUS

## 2020-05-21 MED ORDER — MECLIZINE HCL 25 MG PO TABS
25.0000 mg | ORAL_TABLET | Freq: Three times a day (TID) | ORAL | 0 refills | Status: DC | PRN
Start: 2020-05-21 — End: 2022-05-07

## 2020-05-21 MED ORDER — SODIUM CHLORIDE 0.9% FLUSH
3.0000 mL | Freq: Once | INTRAVENOUS | Status: AC
  Administered 2020-05-21: 3 mL via INTRAVENOUS

## 2020-05-21 NOTE — Discharge Instructions (Signed)
Your symptoms may be related to vertigo.  Please pick up medication and take as needed Increase your water intake to stay hydrated  Keep your appointment with your new PCP as scheduled for 08/02. Discuss your persistent symptoms with them for further evaluation.  Return to the ED IMMEDIATELY for any worsening symptoms including worsening dizziness, passing out, vision changes, speech changes, confusion, gait instability, weakness/numbness, severe headache, chest pain, shortness of breath, or any other new/concerning symptoms.

## 2020-05-21 NOTE — ED Triage Notes (Signed)
Patient c/o dizziness > one month, but states for the past couple of days she feels like she is going to pass out.

## 2020-05-21 NOTE — ED Provider Notes (Signed)
[facility]-EMERGENCY DEPT Provider Note   CSN: [id number] Arrival date & time: 05/21/20  1020     History Chief Complaint  Patient presents with  . Dizziness  . Near Syncope    Erika Lang is a 49 y.o. female who presents to the ED today with complaint of near syncope and dizziness for the past 5-6 months.  Patient reports that she was originally seen in the ED when this started approximately 5 to 6 months ago.  Per chart review she was seen on 11/07/2019 with complaint of blurry vision and near syncope.  She had a full work-up at that time which was negative.  It was favored that she was having orthostatic hypotension.  Patient reports that since then she has been having the symptoms almost daily.  She states that she is on her feet quite often at work only sit down for about 30 minutes during her shift.  She states that she will have any sudden onset feeling of dizziness like the room is spinning however she also feels like she could pass out at any minute will need to sit down.  This will eventually go away when she rests however will return again the next day.  She states she had a very severe episode yesterday and had to leave work.  She states she has not passed out fully since this has been an ongoing thing.  She denies any chest pain or shortness of breath with these associated symptoms.  She has not seen a PCP for this however recently got insurance and has a first appointment scheduled for 08/02.  She does report she was recently seen for a outer ear infection and was told that her hemoglobin level was quite low and started on over-the-counter iron supplementation.  She states that this has made her feel slightly better in terms of fatigue however has not taken away her dizziness.   Denies fevers, chills, double vision, headache, nausea, vomiting, weakness/numbness, confusion, speech changes, chest pain, shortness of breath, any other associated symptoms.   The  history is provided by the patient and medical records.       History reviewed. No pertinent past medical history.  There are no problems to display for this patient.   History reviewed. No pertinent surgical history.   OB History   No obstetric history on file.     No family history on file.  Social History   Tobacco Use  . Smoking status: Never Smoker  . Smokeless tobacco: Never Used  Substance Use Topics  . Alcohol use: No  . Drug use: No    Home Medications Prior to Admission medications   Medication Sig Start Date End Date Taking? Authorizing Provider  benzonatate (TESSALON) 100 MG capsule Take 2 capsules (200 mg total) by mouth 2 (two) times daily as needed for cough. Patient not taking: Reported on 12/24/2017 12/14/15   Danelle Berry, PA-C  cephALEXin (KEFLEX) 500 MG capsule Take 1 capsule (500 mg total) by mouth 4 (four) times daily. 05/10/20   Roxy Horseman, PA-C  guaiFENesin (ROBITUSSIN) 100 MG/5ML liquid Take 5-10 mLs (100-200 mg total) by mouth every 4 (four) hours as needed for cough. Patient not taking: Reported on 12/24/2017 12/14/15   Danelle Berry, PA-C  ibuprofen (ADVIL,MOTRIN) 200 MG tablet Take 200 mg by mouth every 6 (six) hours as needed for moderate pain.    [provider]  meclizine (ANTIVERT) 25 MG tablet Take 1 tablet (25 mg total) by mouth  3 (three) times daily as needed for dizziness. 05/21/20   Hyman Hopes, Aalijah Lanphere, PA-C  neomycin-polymyxin-hydrocortisone (CORTISPORIN) OTIC solution Place 3 drops into both ears 4 (four) times daily. 05/09/20   Bennie Pierini, FNP    Allergies    Patient has no known allergies.  Review of Systems   Review of Systems  Constitutional: Negative for chills and fever.  Eyes: Negative for visual disturbance.  Respiratory: Negative for shortness of breath.   Cardiovascular: Negative for chest pain.  Gastrointestinal: Negative for abdominal pain, nausea and vomiting.  Neurological: Positive for dizziness and  light-headedness. Negative for syncope, speech difficulty, weakness, numbness and headaches.       + near syncope  All other systems reviewed and are negative.   Physical Exam Updated Vital Signs BP (!) 142/84 (BP Location: Left Arm)   Pulse (!) 107   Temp 98.5 F (36.9 C) (Oral)   Resp 16   Ht 5\' 5"  (1.651 m)   Wt 90.7 kg   SpO2 100%   BMI 33.28 kg/m   Physical Exam Vitals and nursing note reviewed.  Constitutional:      Appearance: She is not ill-appearing or diaphoretic.  HENT:     Head: Normocephalic and atraumatic.     Right Ear: Tympanic membrane normal.     Left Ear: Tympanic membrane normal.  Eyes:     Extraocular Movements: Extraocular movements intact.     Conjunctiva/sclera: Conjunctivae normal.     Pupils: Pupils are equal, round, and reactive to light.     Comments: No nystagmus  Cardiovascular:     Rate and Rhythm: Regular rhythm. Tachycardia present.     Pulses: Normal pulses.  Pulmonary:     Effort: Pulmonary effort is normal.     Breath sounds: Normal breath sounds. No wheezing, rhonchi or rales.  Abdominal:     Palpations: Abdomen is soft.     Tenderness: There is no abdominal tenderness. There is no guarding or rebound.  Musculoskeletal:     Cervical back: Neck supple.  Skin:    General: Skin is warm and dry.  Neurological:     Mental Status: She is alert.     Comments: CN 3-12 grossly intact A&O x4 GCS 15 Sensation and strength intact Gait nonataxic including with tandem walking Coordination with finger-to-nose WNL Neg romberg, neg pronator drift     ED Results / Procedures / Treatments   Labs (all labs ordered are listed, but only abnormal results are displayed) Labs Reviewed  CBC - Abnormal; Notable for the following components:      Result Value   Hemoglobin 10.9 (*)    HCT 35.4 (*)    RDW 17.5 (*)    All other components within normal limits  URINALYSIS, ROUTINE W REFLEX MICROSCOPIC - Abnormal; Notable for the following  components:   APPearance HAZY (*)    Hgb urine dipstick LARGE (*)    Ketones, ur 5 (*)    Protein, ur 30 (*)    Leukocytes,Ua TRACE (*)    RBC / HPF >50 (*)    Bacteria, UA RARE (*)    All other components within normal limits  CBG MONITORING, ED - Abnormal; Notable for the following components:   Glucose-Capillary 101 (*)    All other components within normal limits  BASIC METABOLIC PANEL  I-STAT BETA HCG BLOOD, ED ([facility], [facility], [facility] ONLY)    EKG EKG Interpretation  Date/Time:  Tuesday May 21 2020 10:46:05 EDT Ventricular Rate:  90 PR  Interval:    QRS Duration: 103 QT Interval:  364 QTC Calculation: 446 R Axis:   137 Text Interpretation: Right and left arm electrode reversal, interpretation assumes no reversal Sinus or ectopic atrial rhythm Right axis deviation Low voltage, extremity and precordial leads Nonspecific T abnormalities, lateral leads No acute changes No significant change since last tracing Confirmed by Derwood KaplanNanavati, Ankit [phone]([id number]) on 05/21/2020 3:44:47 PM   Radiology No results found.  Procedures Procedures (including critical care time)  Medications Ordered in ED Medications  sodium chloride flush (NS) 0.9 % injection 3 mL (3 mLs Intravenous Given 05/21/20 1434)  meclizine (ANTIVERT) tablet 25 mg (25 mg Oral Given 05/21/20 1456)  sodium chloride 0.9 % bolus 1,000 mL (1,000 mLs Intravenous New Bag/Given 05/21/20 1456)    ED Course  I have reviewed the triage vital signs and the nursing notes.  Pertinent labs & imaging results that were available during my care of the patient were reviewed by me and considered in my medical decision making (see chart for details).    MDM Rules/Calculators/A&P                          49 year old female who presents to the ED today with complaint of her spinning dizziness as well as near syncope for the past 5 to 6 months.  States this is an every day occurrence, and a severe episode yesterday prompting her to come to the ED for  further evaluation.  Has not been evaluated by PCP for same.  On arrival to the ED patient is afebrile and nontachypneic.  Heart rate initially 99, with recheck 107.  Patient is tachycardic on my exam however she states she is normally nervous.  He has no focal neuro deficits on exam today.  No nystagmus appreciated to suggest vertigo however this is still on my differential given complaint of room spinning asked. Was seen several months ago for initial episode with suspected orthostatic hypotension. Will check orthostatics at this time. Question dehydration vs anemia vs vertigo. Pt without complaints of chest pain or SOB with episodes. I doubt arrhythmia causing sx at this time however will obtain EKG. Pt without full syncope and no risk factors for PE. Lab work was obtained while patient was in the waiting room.  CBC is noted with a hemoglobin of 10.9.  This does appear to be patient's baseline however she does report she was told she was recently anemic and started taking over-the-counter iron supplementation.  BMP without any electrolyte abnormalities.  Creatinine 0.72.  UA with trace leuks, greater than 50 red blood cells.  Patient is currently on her menses.   EKG without significant change from previous.  Orthostatics normal at this time however pt does appear somewhat dry on exam. Will provide fluids at this time and meclizine and reassess.   On reassessment pt resting comfortably. She does report improvement in her symptoms. Reports she ambulated to the bathroom without difficulty or sensation of room spinning dizziness. Will discharge home at this time with meclizine PRN. Pt has an appointment to establish care with PCP on 08/02; advised to keep. Strict return precautions have been discussed with pt including worsening dizziness, passing out, vision changes, speech changes, confusion, gait instability, weakness/numbness, severe headache, chest pain, SOB. Pt is in agreement with plan and stable for  discharge home.   This note was prepared using Dragon voice recognition software and may include unintentional dictation errors due to the inherent limitations  of voice recognition software.  Final Clinical Impression(s) / ED Diagnoses Final diagnoses:  Near syncope  Dizziness  Vertigo    Rx / DC Orders ED Discharge Orders         Ordered    meclizine (ANTIVERT) 25 MG tablet  3 times daily PRN     Discontinue  Reprint     05/21/20 1604           Discharge Instructions     Your symptoms may be related to vertigo.  Please pick up medication and take as needed Increase your water intake to stay hydrated  Keep your appointment with your new PCP as scheduled for 08/02. Discuss your persistent symptoms with them for further evaluation.  Return to the ED IMMEDIATELY for any worsening symptoms including worsening dizziness, passing out, vision changes, speech changes, confusion, gait instability, weakness/numbness, severe headache, chest pain, shortness of breath, or any other new/concerning symptoms.        Tanda Rockers, PA-C 05/21/20 1606    Derwood Kaplan, MD 05/21/20 [phone]

## 2020-05-23 ENCOUNTER — Telehealth: Payer: BLUE CROSS/BLUE SHIELD | Admitting: Family

## 2020-05-23 DIAGNOSIS — A09 Infectious gastroenteritis and colitis, unspecified: Secondary | ICD-10-CM

## 2020-05-23 DIAGNOSIS — K921 Melena: Secondary | ICD-10-CM

## 2020-05-23 NOTE — Progress Notes (Signed)
Based on what you shared with me, I feel your condition warrants further evaluation and I recommend that you be seen for a face to face office visit.   NOTE: If you entered your credit card information for this eVisit, you will not be charged. You may see a "hold" on your card for the $35 but that hold will drop off and you will not have a charge processed.   If you are having a true medical emergency please call 911.      For an urgent face to face visit, [facility] has five urgent care centers for your convenience:      NEW:  [facility] [place] Get Driving Directions [phone] [address] [place], [place] [address] . 10 am - 6pm Monday - Friday    [facility] Get Driving Directions [phone] [address] [place], [place] [address] . 10 am to 8 pm Monday-Friday . 12 pm to 8 pm Saturday-Sunday     [facility] Get Driving Directions [phone] [address], [address] [place], [place] [address] . 8 am to 8 pm Monday-Friday . 9 am to 6 pm Saturday . 11 am to 6 pm Sunday     [facility] [place] Get Driving Directions  [phone] [address] [place], [place] [address] . 8 am to 8 pm Monday-Friday . 8 am to 4 pm Saturday-Sunday   [facility] [place] Get Driving Directions [phone] [address] [place],  [address] . 12 pm to 6 pm Monday-Friday      Your e-visit answers were reviewed by a board certified advanced clinical practitioner to complete your personal care plan.  Thank you for using e-Visits.     

## 2020-06-10 ENCOUNTER — Other Ambulatory Visit: Payer: Self-pay

## 2020-06-10 ENCOUNTER — Encounter: Payer: Self-pay | Admitting: Registered Nurse

## 2020-06-10 ENCOUNTER — Ambulatory Visit (INDEPENDENT_AMBULATORY_CARE_PROVIDER_SITE_OTHER): Payer: BLUE CROSS/BLUE SHIELD | Admitting: Registered Nurse

## 2020-06-10 VITALS — BP 127/90 | HR 110 | Temp 97.6°F | Resp 18 | Ht 65.0 in | Wt 209.0 lb

## 2020-06-10 DIAGNOSIS — F410 Panic disorder [episodic paroxysmal anxiety] without agoraphobia: Secondary | ICD-10-CM

## 2020-06-10 DIAGNOSIS — Z7689 Persons encountering health services in other specified circumstances: Secondary | ICD-10-CM

## 2020-06-10 DIAGNOSIS — R42 Dizziness and giddiness: Secondary | ICD-10-CM | POA: Diagnosis not present

## 2020-06-10 LAB — POCT URINALYSIS DIP (CLINITEK)
Blood, UA: NEGATIVE
Glucose, UA: NEGATIVE mg/dL
Leukocytes, UA: NEGATIVE
Nitrite, UA: NEGATIVE
POC PROTEIN,UA: 30 — AB
Spec Grav, UA: >=1.03 — AB (ref 1.010–1.025)
Urobilinogen, UA: 1 E.U./dL
pH, UA: 5.5 (ref 5.0–8.0)

## 2020-06-10 LAB — POCT GLYCOSYLATED HEMOGLOBIN (HGB A1C): Hemoglobin A1C: 5.3 % (ref 4.0–5.6)

## 2020-06-10 MED ORDER — ESCITALOPRAM OXALATE 10 MG PO TABS: 10.0000 mg | ORAL_TABLET | Freq: Every day | ORAL | 0 refills | Status: DC

## 2020-06-10 MED ORDER — HYDROXYZINE HCL 25 MG PO TABS: 25.0000 mg | ORAL_TABLET | Freq: Three times a day (TID) | ORAL | 0 refills | Status: DC | PRN

## 2020-06-10 NOTE — Patient Instructions (Signed)
° ° ° °  If you have lab work done today you will be contacted with your lab results within the next 2 weeks.  If you have not heard from us then please contact us. The fastest way to get your results is to register for My Chart. ° ° °IF you received an x-ray today, you will receive an invoice from [facility]. Please contact [facility] at [phone] with questions or concerns regarding your invoice.  ° °IF you received labwork today, you will receive an invoice from [facility]. Please contact [facility] at [phone] with questions or concerns regarding your invoice.  ° °Our billing staff will not be able to assist you with questions regarding bills from these companies. ° °You will be contacted with the lab results as soon as they are available. The fastest way to get your results is to activate your My Chart account. Instructions are located on the last page of this paperwork. If you have not heard from us regarding the results in 2 weeks, please contact this office. °  ° ° ° °

## 2020-06-10 NOTE — Progress Notes (Signed)
New Patient Office Visit  Subjective:  Patient ID: Erika Lang, female    DOB: 1971-02-24  Age: 49 y.o. MRN: [id number]  CC:  Chief Complaint  Patient presents with  . New Patient (Initial Visit)    establish care. Patient states she would like to address the way she keeps getting dizzy she states she has been at work and feels like she has been about to fall out. She states she has this feeling since may but its getting more intense.    HPI Erika Lang presents for visit to establish care.  C/o dizzy spells for the past 6 months +/-.  Start suddenly, last minutes to an hour, usually 15-30 minutes. Feels dizzy, lightheaded, afraid she will pass out. Also notes occasional red, itchy, disseminated rash across neck and torso when she gets stressed out.  Denies chest pain, palpitations, shob, doe, headaches, numbness/weakness/tingling. No clear pattern to events except that they tend to happen when working or driving. No LOC  Has been seen in ED for this - no acute findings on EKG, labs unremarkable for a direct cause - did not some IDA which was addressed with OTC fe supplementation - pt states good effect.  From a psychosocial perspective, pt reports she has worked for [employer] for 14 years - around the time of onset, she switched locations, states that it has been a lot more stressful since switching and states that this may be affecting how she's feeling.  Diet unchanged, home unchanged.   No past medical history on file.  No past surgical history on file.  Family History  Problem Relation Age of Onset  . Cancer Mother   . Diabetes Mother   . Multiple myeloma Father   . Healthy Brother   . Healthy Daughter   . Healthy Son     Social History   Socioeconomic History  . Marital status: Married    Spouse name: Not on file  . Number of children: Not on file  . Years of education: Not on file  . Highest education level: Not on file  Occupational History  . Not on  file  Tobacco Use  . Smoking status: Never Smoker  . Smokeless tobacco: Never Used  Substance and Sexual Activity  . Alcohol use: No  . Drug use: No  . Sexual activity: Yes  Other Topics Concern  . Not on file  Social History Narrative  . Not on file   Social Determinants of Health   Financial Resource Strain:   . Difficulty of Paying Living Expenses:   Food Insecurity:   . Worried About Charity fundraiser in the Last Year:   . Arboriculturist in the Last Year:   Transportation Needs:   . Film/video editor (Medical):   Marland Kitchen Lack of Transportation (Non-Medical):   Physical Activity:   . Days of Exercise per Week:   . Minutes of Exercise per Session:   Stress:   . Feeling of Stress :   Social Connections:   . Frequency of Communication with Friends and Family:   . Frequency of Social Gatherings with Friends and Family:   . Attends Religious Services:   . Active Member of Clubs or Organizations:   . Attends Archivist Meetings:   Marland Kitchen Marital Status:   Intimate Partner Violence:   . Fear of Current or Ex-Partner:   . Emotionally Abused:   Marland Kitchen Physically Abused:   . Sexually Abused:  ROS Review of Systems  Constitutional: Negative for activity change, appetite change, chills, diaphoresis, fatigue, fever and unexpected weight change.  HENT: Negative.   Eyes: Negative.   Respiratory: Negative.   Cardiovascular: Negative.   Gastrointestinal: Negative.   Endocrine: Negative.   Genitourinary: Negative.   Musculoskeletal: Negative.   Skin: Negative.   Allergic/Immunologic: Negative.   Neurological: Positive for dizziness and light-headedness. Negative for tremors, seizures, syncope, facial asymmetry, speech difficulty, weakness, numbness and headaches.  Hematological: Negative.   Psychiatric/Behavioral: Negative for agitation, behavioral problems, confusion, decreased concentration, dysphoric mood, hallucinations, self-injury, sleep disturbance and suicidal  ideas. The patient is nervous/anxious. The patient is not hyperactive.   All other systems reviewed and are negative.   Objective:   Today's Vitals: BP (!) 127/90   Pulse (!) 110   Temp 97.6 F (36.4 C) (Temporal)   Resp 18   Ht 5' 5"  (1.651 m)   Wt (!) 209 lb (94.8 kg)   SpO2 99%   BMI 34.78 kg/m   Physical Exam Vitals and nursing note reviewed.  Constitutional:      General: She is not in acute distress.    Appearance: Normal appearance. She is obese. She is not ill-appearing, toxic-appearing or diaphoretic.  Cardiovascular:     Rate and Rhythm: Normal rate and regular rhythm.  Pulmonary:     Effort: Pulmonary effort is normal. No respiratory distress.  Skin:    General: Skin is warm and dry.     Coloration: Skin is not jaundiced or pale.     Findings: No bruising, erythema, lesion or rash.  Neurological:     General: No focal deficit present.     Mental Status: She is alert and oriented to person, place, and time. Mental status is at baseline.  Psychiatric:        Mood and Affect: Mood normal.        Behavior: Behavior normal.        Thought Content: Thought content normal.        Judgment: Judgment normal.     Assessment & Plan:   Problem List Items Addressed This Visit    None    Visit Diagnoses    Dizziness    -  Primary   Relevant Orders   POCT glycosylated hemoglobin (Hb A1C)   POCT URINALYSIS DIP (CLINITEK)   Vitamin D, 25-hydroxy   Vitamin B12   Thyroid Panel With TSH   CBC with Differential   Comprehensive metabolic panel   Encounter to establish care          Outpatient Encounter Medications as of 06/10/2020  Medication Sig  . ibuprofen (ADVIL,MOTRIN) 200 MG tablet Take 200 mg by mouth every 6 (six) hours as needed for moderate pain.  . meclizine (ANTIVERT) 25 MG tablet Take 1 tablet (25 mg total) by mouth 3 (three) times daily as needed for dizziness.  . benzonatate (TESSALON) 100 MG capsule Take 2 capsules (200 mg total) by mouth 2 (two)  times daily as needed for cough. (Patient not taking: Reported on 12/24/2017)  . cephALEXin (KEFLEX) 500 MG capsule Take 1 capsule (500 mg total) by mouth 4 (four) times daily. (Patient not taking: Reported on 06/10/2020)  . guaiFENesin (ROBITUSSIN) 100 MG/5ML liquid Take 5-10 mLs (100-200 mg total) by mouth every 4 (four) hours as needed for cough. (Patient not taking: Reported on 12/24/2017)  . neomycin-polymyxin-hydrocortisone (CORTISPORIN) OTIC solution Place 3 drops into both ears 4 (four) times daily. (Patient not taking: Reported on  06/10/2020)   No facility-administered encounter medications on file as of 06/10/2020.    Follow-up: No follow-ups on file.   PLAN  Reviewed ED documentation - unfortunately meclizine has not helped pt much - EKG shows no abnormalities of note - lab work is relatively unremarkable besides some bacteria and leuks in urine and mild IDA. Will recheck these today  My top concern would be to address mental health - pt states she has always been "a worrier" but has been worse through [place]. Denies hi/si. Willing to start medication - lexapro 10m PO qhs and hydroxyzine 252mPO tid PRN for breakthrough anxiety, follow up in 6 weeks for med check  Labs collected, will follow up as warranted  Patient encouraged to call clinic with any questions, comments, or concerns.  RiMaximiano CossNP

## 2020-06-11 LAB — CBC WITH DIFFERENTIAL/PLATELET
Basophils Absolute: 0.1 10*3/uL (ref 0.0–0.2)
Basos: 1 %
EOS (ABSOLUTE): 0.1 10*3/uL (ref 0.0–0.4)
Eos: 2 %
Hematocrit: 37.5 % (ref 34.0–46.6)
Hemoglobin: 11.8 g/dL (ref 11.1–15.9)
Immature Grans (Abs): 0 10*3/uL (ref 0.0–0.1)
Immature Granulocytes: 0 %
Lymphocytes Absolute: 1.5 10*3/uL (ref 0.7–3.1)
Lymphs: 26 %
MCH: 26.5 pg — ABNORMAL LOW (ref 26.6–33.0)
MCHC: 31.5 g/dL (ref 31.5–35.7)
MCV: 84 fL (ref 79–97)
Monocytes Absolute: 0.4 10*3/uL (ref 0.1–0.9)
Monocytes: 7 %
Neutrophils Absolute: 3.6 10*3/uL (ref 1.4–7.0)
Neutrophils: 64 %
Platelets: 382 10*3/uL (ref 150–450)
RBC: 4.46 x10E6/uL (ref 3.77–5.28)
RDW: 16.2 % — ABNORMAL HIGH (ref 11.7–15.4)
WBC: 5.7 10*3/uL (ref 3.4–10.8)

## 2020-06-11 LAB — THYROID PANEL WITH TSH
Free Thyroxine Index: 1.7 (ref 1.2–4.9)
T3 Uptake Ratio: 23 % — ABNORMAL LOW (ref 24–39)
T4, Total: 7.3 ug/dL (ref 4.5–12.0)
TSH: 2.76 u[IU]/mL (ref 0.450–4.500)

## 2020-06-11 LAB — COMPREHENSIVE METABOLIC PANEL
ALT: 31 IU/L (ref 0–32)
AST: 26 IU/L (ref 0–40)
Albumin/Globulin Ratio: 1.4 (ref 1.2–2.2)
Albumin: 4.2 g/dL (ref 3.8–4.8)
Alkaline Phosphatase: 78 IU/L (ref 48–121)
BUN/Creatinine Ratio: 11 (ref 9–23)
BUN: 8 mg/dL (ref 6–24)
Bilirubin Total: 0.8 mg/dL (ref 0.0–1.2)
CO2: 21 mmol/L (ref 20–29)
Calcium: 9.3 mg/dL (ref 8.7–10.2)
Chloride: 104 mmol/L (ref 96–106)
Creatinine, Ser: 0.73 mg/dL (ref 0.57–1.00)
GFR calc Af Amer: 113 mL/min/{1.73_m2} (ref >59)
GFR calc non Af Amer: 98 mL/min/{1.73_m2} (ref >59)
Globulin, Total: 3.1 g/dL (ref 1.5–4.5)
Glucose: 100 mg/dL — ABNORMAL HIGH (ref 65–99)
Potassium: 4.3 mmol/L (ref 3.5–5.2)
Sodium: 138 mmol/L (ref 134–144)
Total Protein: 7.3 g/dL (ref 6.0–8.5)

## 2020-06-11 LAB — VITAMIN B12: Vitamin B-12: 284 pg/mL (ref 232–1245)

## 2020-06-11 LAB — VITAMIN D 25 HYDROXY (VIT D DEFICIENCY, FRACTURES): Vit D, 25-Hydroxy: 17.1 ng/mL — ABNORMAL LOW (ref 30.0–100.0)

## 2020-06-14 ENCOUNTER — Telehealth: Payer: Self-pay | Admitting: Registered Nurse

## 2020-06-14 NOTE — Telephone Encounter (Signed)
Pt was advised we request 10 days to result items and that we will likely call next week

## 2020-06-14 NOTE — Telephone Encounter (Signed)
Patient is calling back for clarification on her lab work. Please advise CB- [phone]

## 2020-06-20 NOTE — Telephone Encounter (Signed)
Pt called and is wanting test results of labs that were done on 06/10/20 Please advise.

## 2020-06-23 ENCOUNTER — Encounter: Payer: Self-pay | Admitting: Registered Nurse

## 2020-06-24 ENCOUNTER — Encounter: Payer: Self-pay | Admitting: Registered Nurse

## 2020-06-24 DIAGNOSIS — E559 Vitamin D deficiency, unspecified: Secondary | ICD-10-CM | POA: Insufficient documentation

## 2020-06-24 MED ORDER — VITAMIN D (ERGOCALCIFEROL) 1.25 MG (50000 UNIT) PO CAPS: 50000.0000 [IU] | ORAL_CAPSULE | ORAL | 0 refills | Status: DC

## 2020-06-25 ENCOUNTER — Emergency Department (INDEPENDENT_AMBULATORY_CARE_PROVIDER_SITE_OTHER): Payer: BLUE CROSS/BLUE SHIELD

## 2020-06-25 ENCOUNTER — Emergency Department (INDEPENDENT_AMBULATORY_CARE_PROVIDER_SITE_OTHER)
Admission: EM | Admit: 2020-06-25 | Discharge: 2020-06-25 | Disposition: A | Discharge status: Home / Self Care | Payer: BLUE CROSS/BLUE SHIELD | Source: Ambulatory Visit | Attending: Family Medicine | Admitting: Family Medicine

## 2020-06-25 ENCOUNTER — Other Ambulatory Visit: Payer: Self-pay

## 2020-06-25 VITALS — BP 130/86 | HR 84 | Temp 98.6°F | Resp 16

## 2020-06-25 DIAGNOSIS — E559 Vitamin D deficiency, unspecified: Secondary | ICD-10-CM

## 2020-06-25 DIAGNOSIS — R109 Unspecified abdominal pain: Secondary | ICD-10-CM

## 2020-06-25 DIAGNOSIS — K7689 Other specified diseases of liver: Secondary | ICD-10-CM | POA: Diagnosis not present

## 2020-06-25 DIAGNOSIS — R42 Dizziness and giddiness: Secondary | ICD-10-CM

## 2020-06-25 NOTE — ED Provider Notes (Signed)
[facility]    CSN: [id number] Arrival date & time: 06/25/20  1255      History   Chief Complaint Chief Complaint  Patient presents with  . Appointment    1:00  . Fatigue    HPI Erika Lang is a 49 y.o. female.   Patient is not acutely ill, and presents to discuss recent lab tests obtained by her PCP.  She complains of fatigue for about 5 months.  She also complains of dizziness for about a year, and hoarseness. Her dizziness has not responded to meclizine in the past.  She complains of excessive abdominal gas/bloating with right upper quadrant pain after eating, and is concerned about possible gallstones.  She has a family history of gallbladder disease (mother and brother).  She was recently started on Lexapro for anxiety. Review of recent lab tests:  Normal TSH, T4, and FTI.  Normal CMP except glucose 100.  CBC remarkable for normal Hgb and WBC.  Hgb A1c normal.  Vitamin B12 normal.  Vitamin D, 25-OH 17.1 ng/mL  The history is provided by the patient.    History reviewed. No pertinent past medical history.  Patient Active Problem List   Diagnosis Date Noted  . Vitamin D deficiency 06/24/2020    History reviewed. No pertinent surgical history.  OB History   No obstetric history on file.      Home Medications    Prior to Admission medications   Medication Sig Start Date End Date Taking? Authorizing Provider  benzonatate (TESSALON) 100 MG capsule Take 2 capsules (200 mg total) by mouth 2 (two) times daily as needed for cough. Patient not taking: Reported on 12/24/2017 12/14/15   Delsa Grana, PA-C  cephALEXin (KEFLEX) 500 MG capsule Take 1 capsule (500 mg total) by mouth 4 (four) times daily. Patient not taking: Reported on 06/10/2020 05/10/20   Montine Circle, PA-C  escitalopram (LEXAPRO) 10 MG tablet Take 1 tablet (10 mg total) by mouth daily. 06/10/20   Maximiano Coss, NP  guaiFENesin (ROBITUSSIN) 100 MG/5ML liquid Take 5-10 mLs (100-200 mg total) by  mouth every 4 (four) hours as needed for cough. Patient not taking: Reported on 12/24/2017 12/14/15   Delsa Grana, PA-C  hydrOXYzine (ATARAX/VISTARIL) 25 MG tablet Take 1 tablet (25 mg total) by mouth 3 (three) times daily as needed. 06/10/20   Maximiano Coss, NP  ibuprofen (ADVIL,MOTRIN) 200 MG tablet Take 200 mg by mouth every 6 (six) hours as needed for moderate pain.    [provider]  meclizine (ANTIVERT) 25 MG tablet Take 1 tablet (25 mg total) by mouth 3 (three) times daily as needed for dizziness. 05/21/20   Alroy Bailiff, Margaux, PA-C  neomycin-polymyxin-hydrocortisone (CORTISPORIN) OTIC solution Place 3 drops into both ears 4 (four) times daily. Patient not taking: Reported on 06/10/2020 05/09/20   Chevis Pretty, FNP  Vitamin D, Ergocalciferol, (DRISDOL) 1.25 MG (50000 UNIT) CAPS capsule Take 1 capsule (50,000 Units total) by mouth every 7 (seven) days. 06/24/20   Maximiano Coss, NP    Family History Family History  Problem Relation Age of Onset  . Cancer Mother   . Diabetes Mother   . Multiple myeloma Father   . Healthy Brother   . Healthy Daughter   . Healthy Son     Social History Social History   Tobacco Use  . Smoking status: Never Smoker  . Smokeless tobacco: Never Used  Substance Use Topics  . Alcohol use: Yes    Comment: occ  . Drug use:  No     Allergies   Patient has no known allergies.   Review of Systems Review of Systems  Constitutional: Positive for fatigue. Negative for activity change, appetite change, chills, diaphoresis and fever.  HENT:       Hoarseness  Eyes: Negative.   Respiratory: Negative.   Cardiovascular: Negative.   Gastrointestinal: Positive for abdominal pain. Negative for constipation, diarrhea, nausea and vomiting.  Genitourinary: Negative.   Musculoskeletal: Negative.   Skin: Negative.   Neurological: Positive for dizziness.  Hematological: Negative.   Psychiatric/Behavioral: The patient is nervous/anxious.       Physical Exam Triage Vital Signs ED Triage Vitals  Enc Vitals Group     BP 06/25/20 1315 130/86     Pulse Rate 06/25/20 1315 84     Resp 06/25/20 1315 16     Temp 06/25/20 1315 98.6 F (37 C)     Temp Source 06/25/20 1315 Oral     SpO2 06/25/20 1315 100 %     Weight --      Height --      Head Circumference --      Peak Flow --      Pain Score 06/25/20 1313 0     Pain Loc --      Pain Edu? --      Excl. in [place]? --    No data found.  Updated Vital Signs BP 130/86 (BP Location: Right Arm)   Pulse 84   Temp 98.6 F (37 C) (Oral)   Resp 16   SpO2 100%   Visual Acuity Right Eye Distance:   Left Eye Distance:   Bilateral Distance:    Right Eye Near:   Left Eye Near:    Bilateral Near:     Physical Exam Nursing notes and Vital Signs reviewed. Appearance:  Patient appears stated age, and in no acute distress.  She is obese. Eyes:  Pupils are equal, round, and reactive to light and accomodation.  Extraocular movement is intact.  Conjunctivae are not inflamed  Ears:  Canals normal.  Tympanic membranes normal.  Nose:   Normal turbinates.  No sinus tenderness.  Neck:  Supple. No adenopathy or thyromegaly.  Lungs:  Clear to auscultation.  Breath sounds are equal.  Moving air well. Heart:  Regular rate and rhythm without murmurs, rubs, or gallops.  Abdomen:  Mild tenderness to palpation right upper quadrant without masses or hepatosplenomegaly.  Bowel sounds are present.  No CVA or flank tenderness.  Extremities:  No edema.  Skin:  No rash present.   UC Treatments / Results  Labs (all labs ordered are listed, but only abnormal results are displayed) Labs Reviewed -  See HPI  EKG   Radiology US Abdomen Limited RUQ  Result Date: 06/25/2020 CLINICAL DATA:  Right upper quadrant pain for 1 year, intermittent in nature EXAM: ULTRASOUND ABDOMEN LIMITED RIGHT UPPER QUADRANT COMPARISON:  None. FINDINGS: Gallbladder: No gallstones or wall thickening visualized. No  sonographic Murphy sign noted by sonographer. Common bile duct: Diameter: 2 mm Liver: Exophytic cyst identified within the right lobe liver abutting the gallbladder, measuring 4.6 x 3.7 by 3.9 cm. No other focal liver abnormalities. Echotexture is normal. Portal vein is patent on color Doppler imaging with normal direction of blood flow towards the liver. Other: None. IMPRESSION: 1. Simple 4.6 cm right lobe hepatic cyst. 2. Otherwise unremarkable right upper quadrant exam. Electronically Signed   By: Randa Ngo M.D.   On: 06/25/2020 15:15  Procedures Procedures (including critical care time)  Medications Ordered in UC Medications - No data to display  Initial Impression / Assessment and Plan / UC Course  I have reviewed the triage vital signs and the nursing notes.  Pertinent labs & imaging results that were available during my care of the patient were reviewed by me and considered in my medical decision making (see chart for details).    Review of past lab records reveals Vitamin D deficiency (17.1 ng/mL) on 06/10/20. Suspect dizziness/light-headedness and fatigue may be a result of Vitamin D deficiency.  Recommend that patient follow-up with her PCP for treatment and monitoring of her Vitamin D deficiency.  GB US was negative.  Her abdominal pain may be resulting from GERD.  Suggest a trial of Prilosec OTC. Followup with Family Doctor in about 8 weeks.  Final Clinical Impressions(s) / UC Diagnoses   Final diagnoses:  Abdominal pain  Dizziness and giddiness  Vitamin D deficiency     Discharge Instructions     Try taking Prilosec OTC, one tab daily for abdominal pain.  Take about 30 minutes before a meal.   ED Prescriptions    None        Kandra Nicolas, MD 07/01/20 1504

## 2020-06-25 NOTE — ED Triage Notes (Signed)
Patient presents to Urgent Care with complaints of fatigue since the past several months. Patient reports she also has been having dizziness, light -headedness, and hoarse voice. Pt went to her PCP and got blood work drawn and wanted to discuss the test results, thinks she might have low thyroid and/or a vitamin D deficiency. Pt is concerned with gallbladder issues, states she is very gassy after she eats and gallbladder problems run in her family.

## 2020-06-25 NOTE — Discharge Instructions (Addendum)
Try taking Prilosec OTC, one tab daily for abdominal pain.  Take about 30 minutes before a meal.

## 2020-10-16 ENCOUNTER — Encounter: Payer: Self-pay | Admitting: Registered Nurse

## 2020-10-30 IMAGING — US US ABDOMEN LIMITED
1 series · 14 of 25 positions shown · non-contrast
Comparison: None.

CLINICAL DATA: Right upper quadrant pain for 1 year, intermittent
in nature

EXAM:
ULTRASOUND ABDOMEN LIMITED RIGHT UPPER QUADRANT

[Series 1: us abdomen limited · 0.09mm/px · 71 acquisitions, 14 frames shown]
[im 1/71]
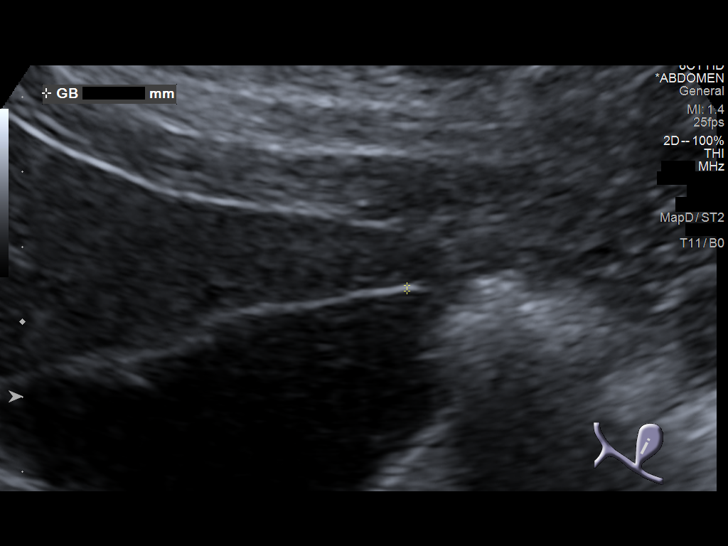
[im 6/71]
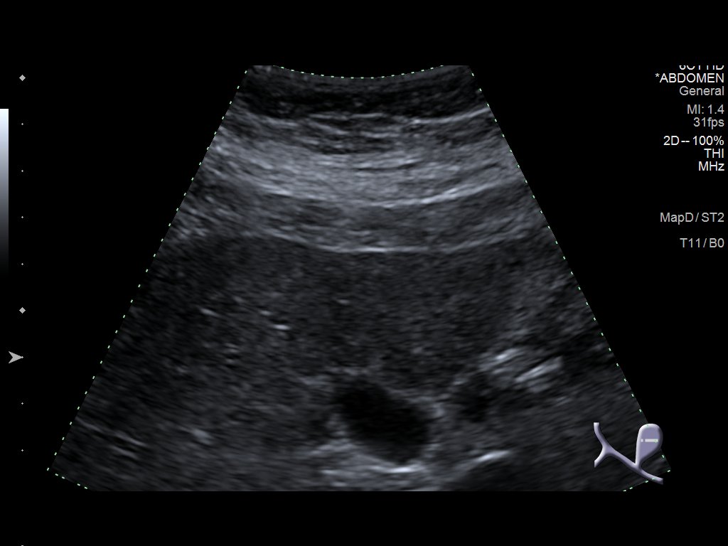
[im 12/71]
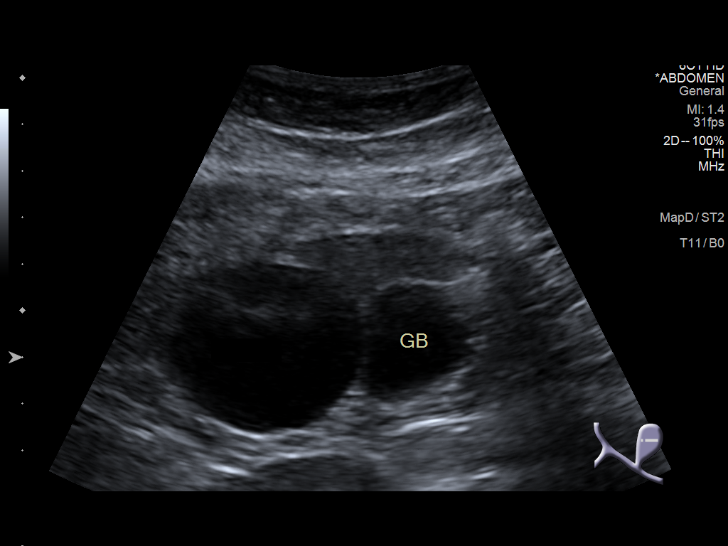
[im 18/71]
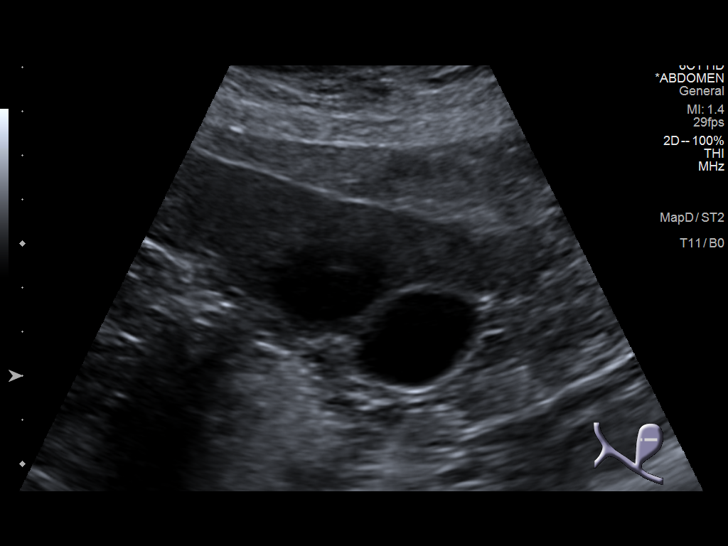
[im 24/71]
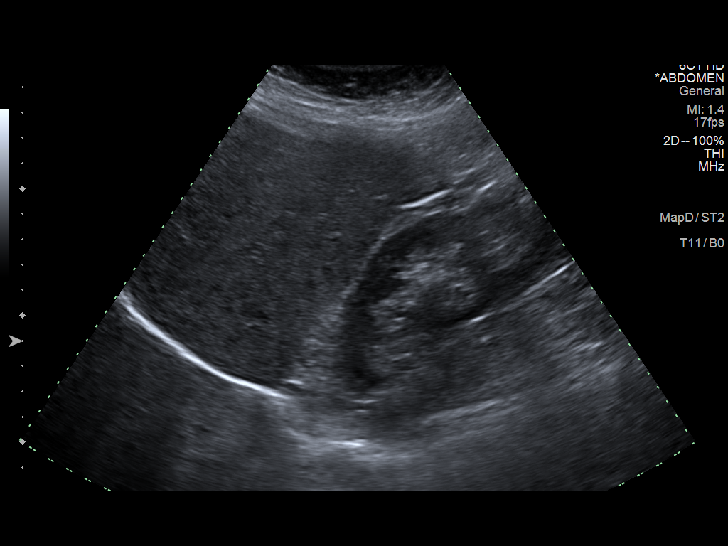
[im 27/71]
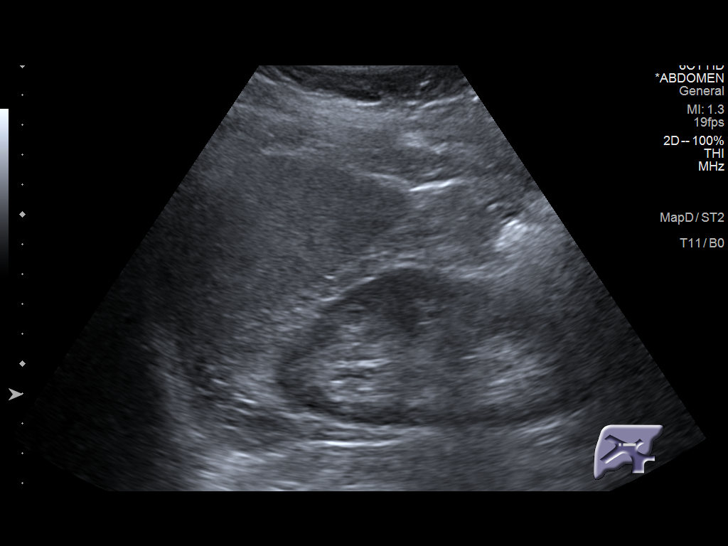
[im 33/71]
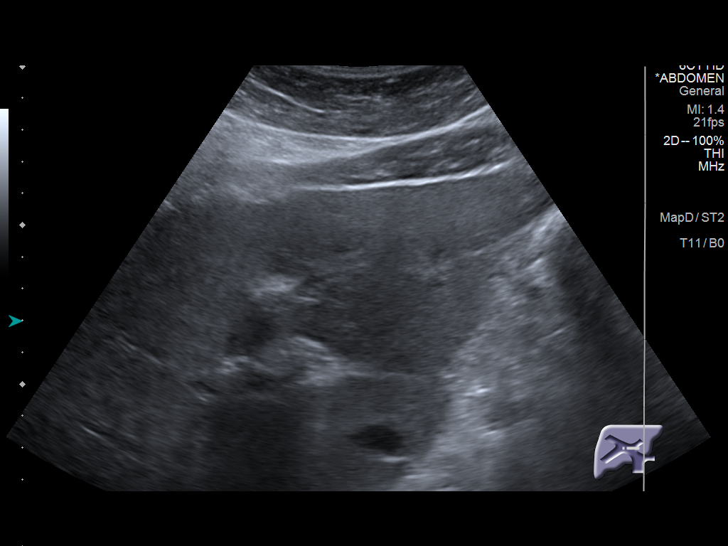
[im 38/71]
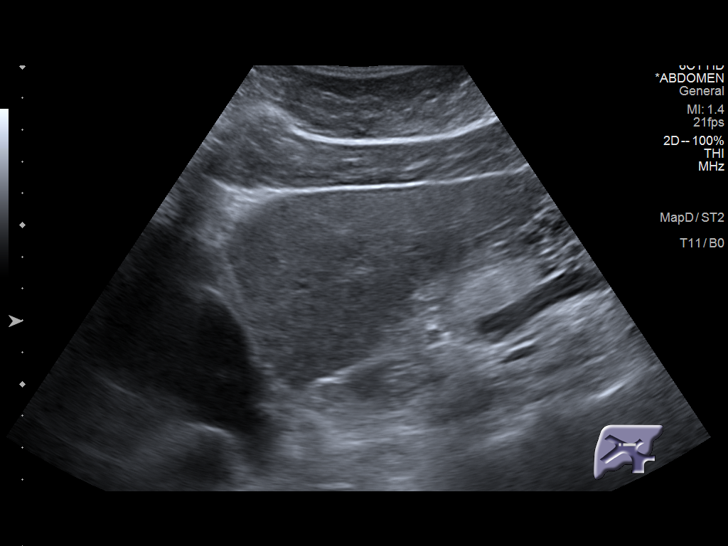
[im 44/71]
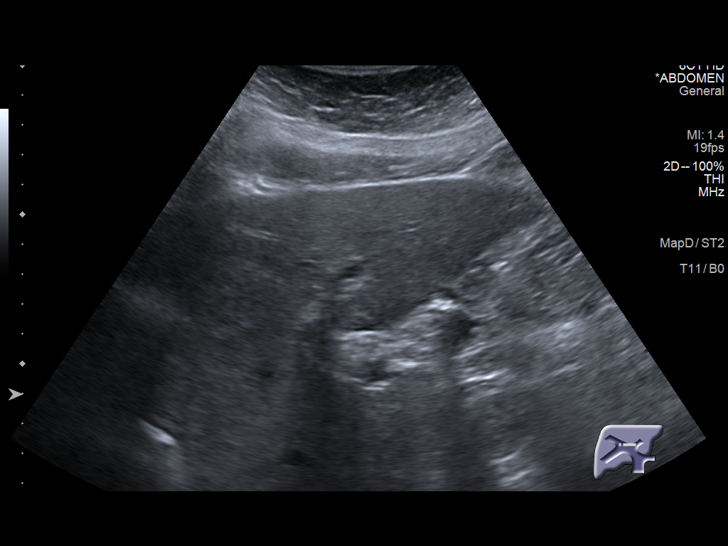
[im 47/71]
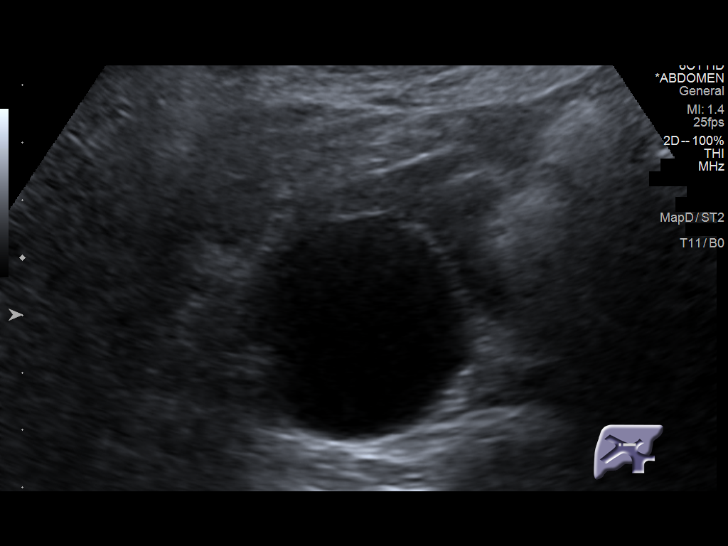
[im 53/71]
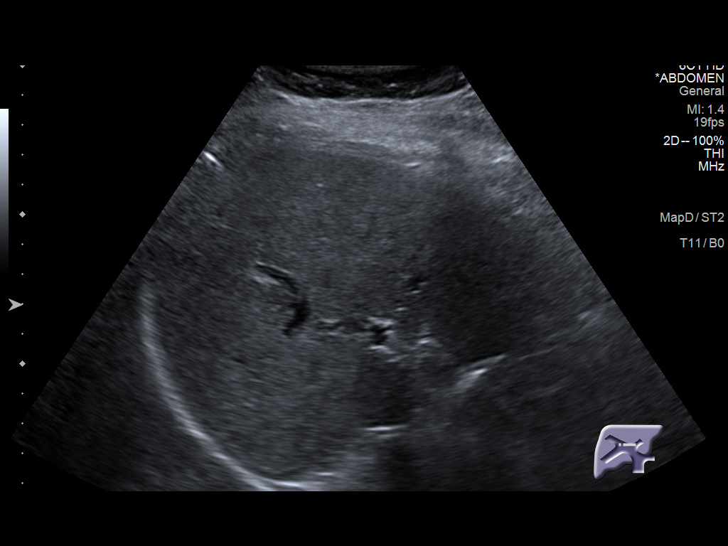
[im 59/71]
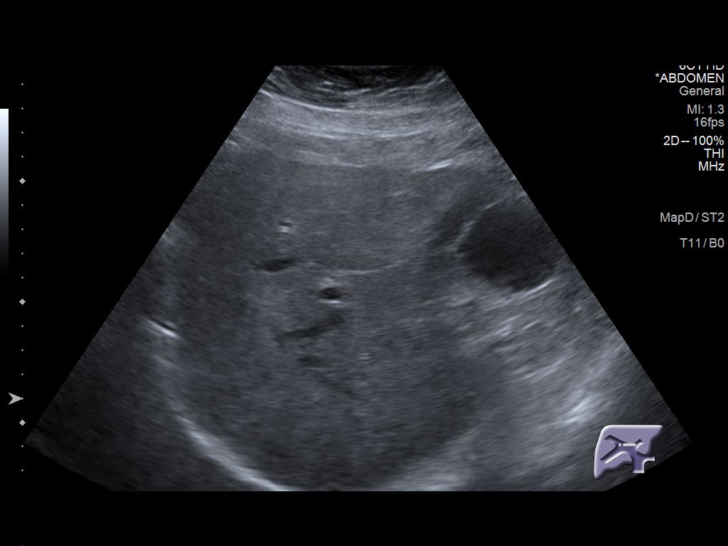
[im 65/71]
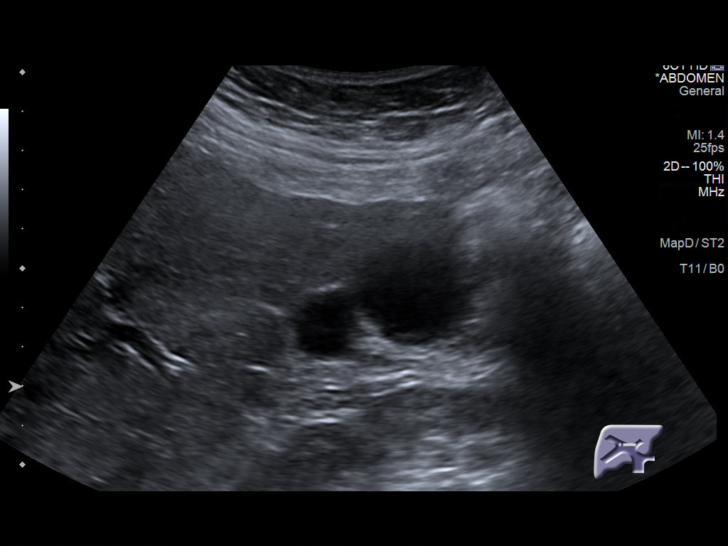
[im 71/71]
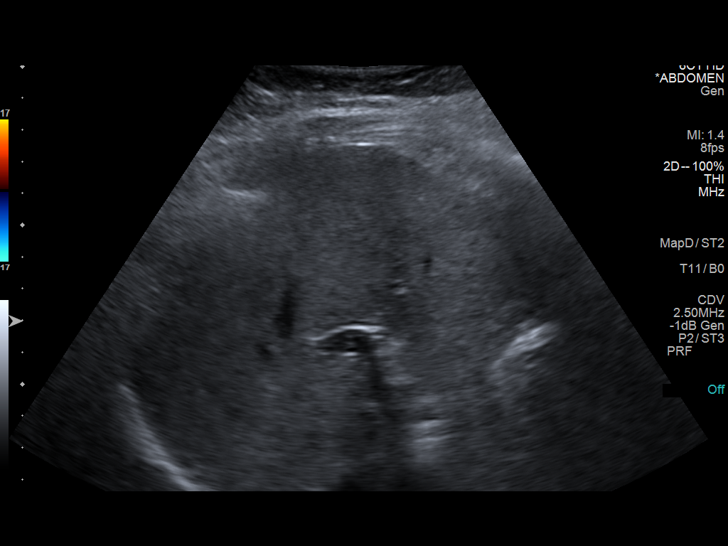

[14 of 25 positions shown; findings below may reference images not displayed]

FINDINGS: Gallbladder:

No gallstones or wall thickening visualized. No sonographic Murphy
sign noted by sonographer.

Common bile duct:

Diameter: 2 mm

Liver:

Exophytic cyst identified within the right lobe liver abutting the
gallbladder, measuring 4.6 x 3.7 by 3.9 cm. No other focal liver
abnormalities. Echotexture is normal. Portal vein is patent on color
Doppler imaging with normal direction of blood flow towards the
liver.

Other: None.
IMPRESSION: 1. Simple 4.6 cm right lobe hepatic cyst.
2. Otherwise unremarkable right upper quadrant exam.

## 2020-11-13 ENCOUNTER — Telehealth: Payer: BLUE CROSS/BLUE SHIELD | Admitting: Family

## 2020-11-13 DIAGNOSIS — H6090 Unspecified otitis externa, unspecified ear: Secondary | ICD-10-CM

## 2020-11-13 MED ORDER — CIPROFLOXACIN-DEXAMETHASONE 0.3-0.1 % OT SUSP: 4.0000 [drp] | Freq: Two times a day (BID) | OTIC | 0 refills | Status: DC

## 2020-11-13 MED ORDER — AMOXICILLIN-POT CLAVULANATE 875-125 MG PO TABS
1.0000 | ORAL_TABLET | Freq: Two times a day (BID) | ORAL | 0 refills | Status: DC
Start: 2020-11-13 — End: 2021-02-03

## 2020-11-13 NOTE — Progress Notes (Signed)
E Visit for Swimmer's Ear  We are sorry that you are not feeling well. Here is how we plan to help!  Based on what you have shared with me it looks like you have swimmers ear. Swimmer's ear is a redness or swelling, irritation, or infection of your outer ear canal.  These symptoms usually occur within a few days of swimming.  Your ear canal is a tube that goes from the opening of the ear to the eardrum.  When water stays in your ear canal, germs can grow.  This is a painful condition that often happens to children and swimmers of all ages.  It is not contagious and oral antibiotics are not required to treat uncomplicated swimmer's ear.  The usual symptoms include: Itching inside the ear, Redness or a sense of swelling in the ear, Pain when the ear is tugged on when pressure is placed on the ear, Pus draining from the infected ear. and I have prescribed: Ciprofloxin 0.2% and dexamethasone otic suspension 4 drops in affected ears twice daily for 7 days  Augmentin 875mg  one tablet by mouth twice a day for 7 days  In certain cases swimmer's ear may progress to a more serious bacterial infection of the middle or inner ear.  If you have a fever 102 and up and significantly worsening symptoms, this could indicate a more serious infection moving to the middle/inner and needs face to face evaluation in an office by a provider.  Your symptoms should improve over the next 3 days and should resolve in about 7 days.  HOME CARE:   Wash your hands frequently.  Do not place the tip of the bottle on your ear or touch it with your fingers.  You can take Acetominophen 650 mg every 4-6 hours as needed for pain.  If pain is severe or moderate, you can apply a heating pad (set on low) or hot water bottle (wrapped in a towel) to outer ear for 20 minutes.  This will also increase drainage.  Avoid ear plugs  Do not use Q-tips  After showers, help the water run out by tilting your head to one side.  GET HELP RIGHT  AWAY IF:   Fever is over 102.2 degrees.  You develop progressive ear pain or hearing loss.  Ear symptoms persist longer than 3 days after treatment.  MAKE SURE YOU:   Understand these instructions.  Will watch your condition.  Will get help right away if you are not doing well or get worse.  TO PREVENT SWIMMER'S EAR:  Use a bathing cap or custom fitted swim molds to keep your ears dry.  Towel off after swimming to dry your ears.  Tilt your head or pull your earlobes to allow the water to escape your ear canal.  If there is still water in your ears, consider using a hairdryer on the lowest setting.  Thank you for choosing an e-visit. Your e-visit answers were reviewed by a board certified advanced clinical practitioner to complete your personal care plan. Depending upon the condition, your plan could have included both over the counter or prescription medications. Please review your pharmacy choice. Be sure that the pharmacy you have chosen is open so that you can pick up your prescription now.  If there is a problem you may message your provider in MyChart to have the prescription routed to another pharmacy. Your safety is important to . If you have drug allergies check your prescription carefully.  For the next  24 hours, you can use MyChart to ask questions about today's visit, request a non-urgent call back, or ask for a work or school excuse from your e-visit provider. You will get an email in the next two days asking about your experience. I hope that your e-visit has been valuable and will speed your recovery.     Approximately 5 minutes was spent documenting and reviewing patient's chart.

## 2020-12-20 ENCOUNTER — Telehealth: Payer: BLUE CROSS/BLUE SHIELD | Admitting: Family

## 2020-12-20 DIAGNOSIS — B353 Tinea pedis: Secondary | ICD-10-CM

## 2020-12-20 DIAGNOSIS — B351 Tinea unguium: Secondary | ICD-10-CM | POA: Diagnosis not present

## 2020-12-20 MED ORDER — CLOTRIMAZOLE 1 % EX OINT: 1.0000 "application " | TOPICAL_OINTMENT | Freq: Two times a day (BID) | CUTANEOUS | 0 refills | Status: DC

## 2020-12-20 MED ORDER — TERBINAFINE HCL 250 MG PO TABS: 250.0000 mg | ORAL_TABLET | Freq: Every day | ORAL | 0 refills | Status: DC

## 2020-12-20 NOTE — Progress Notes (Signed)
E-Visit for Athlete's Foot  We are sorry that you are not feeling well. Here is how we plan to help!  Based on what you shared with me it looks like you have tinea pedis, or "Athlete's Foot".  This type of rash can spread through shared towels, clothing, bedding, etc., as well as hard surfaces (particularly in moist areas) such as shower stalls, locker room floors, pool areas, etc. The symptoms of Athlete's Foot include red, swollen, peeling, itchy skin between the toes (especially between the pinky toe and the one next to it). The sole and heel of the foot may also be affected. In severe cases, the skin on the feet can blister.  Athlete's foot can usually be treated with over-the-counter topical antifungal products; but sometimes with chronic or extensive tinea pedis, prescription oral medications are needed.   I am recommending:Clotrimazole 1% cream or gel, apply to area twice per day   Prescription medications are only indicated for an extensive rash or if over the counter treatments have failed.  I am prescribing:Terbinafine 250 mg once per day for one week  HOME CARE:  . Keep feet clean, dry, and cool. . Avoid using swimming pools, public showers, or foot baths. . Wear sandals when possible or air shoes out by alternating them every 2-3 days. . Avoid wearing closed shoes and wearing socks made from fabric that doesn't dry easily (for example, nylon). . Treat the infection with recommended medication  GET HELP RIGHT AWAY IF:  . Symptoms that don't go away after treatment. . Severe itching that persists. . If your rash spreads or swells. . If your rash begins to have drainage or smell. . You develop a fever.  MAKE SURE YOU   . Understand these instructions. . Will watch your condition. . Will get help right away if you are not doing well or get worse.   Thank you for choosing an e-visit.  Your e-visit answers were reviewed by a board certified advanced clinical  practitioner to complete your personal care plan. Depending upon the condition, your plan could have included both over the counter or prescription medications.  Please review your pharmacy choice. Make sure the pharmacy is open so you can pick up prescription now. If there is a problem, you may contact your provider through [employer] and have the prescription routed to another pharmacy.  Your safety is important to [place]. If you have drug allergies check your prescription carefully.   For the next 24 hours you can use MyChart to ask questions about today's visit, request a non-urgent call back, or ask for a work or school excuse.  You will get an email in the next two days asking about your experience. I hope that your e-visit has been valuable and will speed your recovery  References or for more information:  [employer]com.au?search=[email or web address]%[email or web address]&source=search_result&selectedTitle=1~104&usage_type=default&display_rank=1  [email or web address]   Approximately 5 minutes was spent documenting and reviewing patient's chart.

## 2021-01-16 DIAGNOSIS — Z01419 Encounter for gynecological examination (general) (routine) without abnormal findings: Secondary | ICD-10-CM | POA: Diagnosis not present

## 2021-01-16 DIAGNOSIS — Z6833 Body mass index (BMI) 33.0-33.9, adult: Secondary | ICD-10-CM | POA: Diagnosis not present

## 2021-01-16 LAB — HM PAP SMEAR: HM Pap smear: NORMAL

## 2021-02-03 ENCOUNTER — Telehealth: Payer: BLUE CROSS/BLUE SHIELD | Admitting: Physician Assistant

## 2021-02-03 DIAGNOSIS — H9203 Otalgia, bilateral: Secondary | ICD-10-CM

## 2021-02-03 NOTE — Progress Notes (Signed)
Based on what you shared with me, I feel your condition warrants further evaluation and I recommend that you be seen for a face to face office visit. At this point, giving ongoing symptoms for ~ month duration, you need to be seen in person. If this were an external or middle ear infection, symptoms would have continued to progress until you were very sick or they would have cause rupture of the ear drum which would cause resolution of pain but decreased hearing. I am questioning if the inner ear are not regulating pressure appropriately which is causing your symptoms. I do recommend an ear exam to differentiate between causes of your symptoms giving how long this is going on. Please be seen by your primary care provider or at a local Urgent Care for evaluation. If you cannot be seen today, I recommend starting an OTC nasal steroid like Flonase or Nasacort.    NOTE: If you entered your credit card information for this eVisit, you will not be charged. You may see a "hold" on your card for the $35 but that hold will drop off and you will not have a charge processed.   If you are having a true medical emergency please call 911.      For an urgent face to face visit, [place] has five urgent care centers for your convenience:     [facility] at [facility] Directions [phone] [address] [place], [place] [address] . 10 am - 6pm Monday - Friday    [facility]) Get Driving Directions [phone] [address] [place], [place] [address] . 10 am to 8 pm Monday-Friday . 12 pm to 8 pm [facility] Urgent Care at [facility] Get Driving Directions [phone] [address] [place], [place] [address] . 8 am to 8 pm Monday-Friday . 9 am to 6 pm Saturday . 11 am to 6 pm Sunday     [facility] at [facility] Get Driving Directions  [phone] [address] [place], [place] [address] . 8 am to 8 pm Monday-Friday . 8 am to 4 pm [facility] Urgent Care at [facility] Directions [phone] [address] [place], [place] [address] . 12 pm to 6 pm Monday-Friday      Your e-visit answers were reviewed by a board certified advanced clinical practitioner to complete your personal care plan.  Thank you for using e-Visits.

## 2021-09-25 ENCOUNTER — Telehealth: Payer: BLUE CROSS/BLUE SHIELD | Admitting: Physician Assistant

## 2021-09-25 DIAGNOSIS — L309 Dermatitis, unspecified: Secondary | ICD-10-CM | POA: Diagnosis not present

## 2021-09-26 MED ORDER — TRIAMCINOLONE ACETONIDE 0.1 % EX CREA: 1.0000 "application " | TOPICAL_CREAM | Freq: Two times a day (BID) | CUTANEOUS | 0 refills | Status: DC

## 2021-09-26 NOTE — Progress Notes (Signed)

## 2021-10-07 ENCOUNTER — Telehealth: Payer: BLUE CROSS/BLUE SHIELD | Admitting: Physician Assistant

## 2021-10-07 DIAGNOSIS — R3989 Other symptoms and signs involving the genitourinary system: Secondary | ICD-10-CM | POA: Diagnosis not present

## 2021-10-07 MED ORDER — CEPHALEXIN 500 MG PO CAPS: 500.0000 mg | ORAL_CAPSULE | Freq: Two times a day (BID) | ORAL | 0 refills | Status: DC

## 2021-10-07 NOTE — Progress Notes (Signed)

## 2021-12-06 ENCOUNTER — Encounter: Payer: Self-pay | Admitting: Emergency Medicine

## 2021-12-06 ENCOUNTER — Telehealth: Payer: BC Managed Care – PPO | Admitting: Emergency Medicine

## 2021-12-06 DIAGNOSIS — K0889 Other specified disorders of teeth and supporting structures: Secondary | ICD-10-CM | POA: Diagnosis not present

## 2021-12-06 MED ORDER — AMOXICILLIN 500 MG PO CAPS: 500.0000 mg | ORAL_CAPSULE | Freq: Three times a day (TID) | ORAL | 0 refills | Status: AC

## 2021-12-06 NOTE — Progress Notes (Signed)
I have spent 5 minutes in review of e-visit questionnaire, review and updating patient chart, medical decision making and response to patient.   Amandalee Lacap, PA-C    

## 2021-12-06 NOTE — Progress Notes (Signed)

## 2022-01-05 ENCOUNTER — Other Ambulatory Visit: Payer: Self-pay

## 2022-01-05 ENCOUNTER — Ambulatory Visit
Admission: EM | Admit: 2022-01-05 | Discharge: 2022-01-05 | Disposition: A | Discharge status: Home / Self Care | Payer: BC Managed Care – PPO | Attending: Physician Assistant | Admitting: Physician Assistant

## 2022-01-05 ENCOUNTER — Telehealth: Payer: BC Managed Care – PPO | Admitting: Nurse Practitioner

## 2022-01-05 DIAGNOSIS — J069 Acute upper respiratory infection, unspecified: Secondary | ICD-10-CM

## 2022-01-05 MED ORDER — BENZONATATE 100 MG PO CAPS: 100.0000 mg | ORAL_CAPSULE | Freq: Three times a day (TID) | ORAL | 0 refills | Status: DC | PRN

## 2022-01-05 MED ORDER — FLUTICASONE PROPIONATE 50 MCG/ACT NA SUSP: 2.0000 | Freq: Every day | NASAL | 6 refills | Status: DC

## 2022-01-05 NOTE — ED Triage Notes (Signed)
3 day h/o cough, runny nose, sore throat and congestion that has worsened since the onset. Onset last night of burning sensation in nares. Has been taking theraflu and nyquil without relief. Negative at home covid test. No v/d.  Pt did an e-visit earlier today and was prescribed tessalon and flonase.

## 2022-01-05 NOTE — Discharge Instructions (Signed)
Nasal saline spray as needed Recommend Mucinex D and Delsym Take Tessalon as prescribed.  COVID test pending, will call with results.

## 2022-01-05 NOTE — Progress Notes (Signed)
E-Visit for Upper Respiratory Infection   We are sorry you are not feeling well.  Here is how we plan to help!  Based on what you have shared with me, it looks like you may have a viral upper respiratory infection.  Upper respiratory infections are caused by a large number of viruses; however, rhinovirus is the most common cause. COVID also causes similar symptoms, you may want to take a home COVID test to assure that your symptoms are not from COVID.   Providers prescribe antibiotics to treat infections caused by bacteria. Antibiotics are very powerful in treating bacterial infections when they are used properly. To maintain their effectiveness, they should be used only when necessary. Overuse of antibiotics has resulted in the development of superbugs that are resistant to treatment!    After careful review of your answers, I would not recommend an antibiotic for your condition.  Antibiotics are not effective against viruses and therefore should not be used to treat them. Common examples of infections caused by viruses include colds and flu   Symptoms vary from person to person, with common symptoms including sore throat, cough, fatigue or lack of energy and feeling of general discomfort.  A low-grade fever of up to 100.4 may present, but is often uncommon.  Symptoms vary however, and are closely related to a person's age or underlying illnesses.  The most common symptoms associated with an upper respiratory infection are nasal discharge or congestion, cough, sneezing, headache and pressure in the ears and face.  These symptoms usually persist for about 3 to 10 days, but can last up to 2 weeks.  It is important to know that upper respiratory infections do not cause serious illness or complications in most cases.    Upper respiratory infections can be transmitted from person to person, with the most common method of transmission being a person's hands.  The virus is able to live on the skin and can  infect other persons for up to 2 hours after direct contact.  Also, these can be transmitted when someone coughs or sneezes; thus, it is important to cover the mouth to reduce this risk.  To keep the spread of the illness at bay, good hand hygiene is very important.  This is an infection that is most likely caused by a virus. There are no specific treatments other than to help you with the symptoms until the infection runs its course.  We are sorry you are not feeling well.  Here is how we plan to help!   For nasal congestion, you may use an oral decongestants such as Mucinex D or if you have glaucoma or high blood pressure use plain Mucinex.  Saline nasal spray or nasal drops can help and can safely be used as often as needed for congestion.  For your congestion, I have prescribed Fluticasone nasal spray one spray in each nostril twice a day  If you do not have a history of heart disease, hypertension, diabetes or thyroid disease, prostate/bladder issues or glaucoma, you may also use Sudafed to treat nasal congestion.  It is highly recommended that you consult with a pharmacist or your primary care physician to ensure this medication is safe for you to take.     If you have a cough, you may use cough suppressants such as Delsym and Robitussin.  If you have glaucoma or high blood pressure, you can also use Coricidin HBP.   For cough I have prescribed for you A prescription cough  medication called Tessalon Perles 100 mg. You may take 1-2 capsules every 8 hours as needed for cough  If you have a sore or scratchy throat, use a saltwater gargle-  to  teaspoon of salt dissolved in a 4-ounce to 8-ounce glass of warm water.  Gargle the solution for approximately 15-30 seconds and then spit.  It is important not to swallow the solution.  You can also use throat lozenges/cough drops and Chloraseptic spray to help with throat pain or discomfort.  Warm or cold liquids can also be helpful in relieving throat  pain.  For headache, pain or general discomfort, you can use Ibuprofen or Tylenol as directed.   Some authorities believe that zinc sprays or the use of Echinacea may shorten the course of your symptoms.   HOME CARE Only take medications as instructed by your medical team. Be sure to drink plenty of fluids. Water is fine as well as fruit juices, sodas and electrolyte beverages. You may want to stay away from caffeine or alcohol. If you are nauseated, try taking small sips of liquids. How do you know if you are getting enough fluid? Your urine should be a pale yellow or almost colorless. Get rest. Taking a steamy shower or using a humidifier may help nasal congestion and ease sore throat pain. You can place a towel over your head and breathe in the steam from hot water coming from a faucet. Using a saline nasal spray works much the same way. Cough drops, hard candies and sore throat lozenges may ease your cough. Avoid close contacts especially the very young and the elderly Cover your mouth if you cough or sneeze Always remember to wash your hands.   GET HELP RIGHT AWAY IF: You develop worsening fever. If your symptoms do not improve within 10 days You develop yellow or green discharge from your nose over 3 days. You have coughing fits You develop a severe head ache or visual changes. You develop shortness of breath, difficulty breathing or start having chest pain Your symptoms persist after you have completed your treatment plan  MAKE SURE YOU  Understand these instructions. Will watch your condition. Will get help right away if you are not doing well or get worse.  Thank you for choosing an e-visit.  Your e-visit answers were reviewed by a board certified advanced clinical practitioner to complete your personal care plan. Depending upon the condition, your plan could have included both over the counter or prescription medications.  Please review your pharmacy choice. Make sure the  pharmacy is open so you can pick up prescription now. If there is a problem, you may contact your provider through [employer] and have the prescription routed to another pharmacy.  Your safety is important to [place]. If you have drug allergies check your prescription carefully.   For the next 24 hours you can use MyChart to ask questions about today's visit, request a non-urgent call back, or ask for a work or school excuse. You will get an email in the next two days asking about your experience. I hope that your e-visit has been valuable and will speed your recovery.  I spent approximately 5 minutes reviewing the patient's history, current symptoms and coordinating their plan of care today.    Meds ordered this encounter  Medications   benzonatate (TESSALON) 100 MG capsule    Sig: Take 1 capsule (100 mg total) by mouth 3 (three) times daily as needed for cough.    Dispense:  30 capsule    Refill:  0   fluticasone (FLONASE) 50 MCG/ACT nasal spray    Sig: Place 2 sprays into both nostrils daily.    Dispense:  16 g    Refill:  6

## 2022-01-05 NOTE — ED Provider Notes (Signed)
[facility]    CSN: [id number] Arrival date & time: 01/05/22  1443      History   Chief Complaint Chief Complaint  Patient presents with   Cough    HPI Erika Lang is a 51 y.o. female.   Pt complains of three days of cough, rhinorrhea, sore throat, and congestion.  She reports taking theraflu and nyquil with minimal relief.  She took a home COVID test which was negative.  Denies wheezing, shortness of breath, fever, chills.  She reports burning sensation of bilateral nares.  She had an e-visit earlier today and was prescribed flonase and tessalon.  She has not picked up the tessalon pearls yet.  She reports she feels similar to when she had COVID in the past and would like a COVID test.     History reviewed. No pertinent past medical history.  Patient Active Problem List   Diagnosis Date Noted   Vitamin D deficiency 06/24/2020    History reviewed. No pertinent surgical history.  OB History   No obstetric history on file.      Home Medications    Prior to Admission medications   Medication Sig Start Date End Date Taking? Authorizing Provider  benzonatate (TESSALON) 100 MG capsule Take 1 capsule (100 mg total) by mouth 3 (three) times daily as needed for cough. 01/05/22   Apolonio Schneiders, FNP  Clotrimazole 1 % OINT Apply 1 application topically in the morning and at bedtime. 12/20/20   Evelina Dun A, FNP  escitalopram (LEXAPRO) 10 MG tablet Take 1 tablet (10 mg total) by mouth daily. 06/10/20   Maximiano Coss, NP  fluticasone (FLONASE) 50 MCG/ACT nasal spray Place 2 sprays into both nostrils daily. 01/05/22   Apolonio Schneiders, FNP  hydrOXYzine (ATARAX/VISTARIL) 25 MG tablet Take 1 tablet (25 mg total) by mouth 3 (three) times daily as needed. 06/10/20   Maximiano Coss, NP  ibuprofen (ADVIL,MOTRIN) 200 MG tablet Take 200 mg by mouth every 6 (six) hours as needed for moderate pain.    [provider]  meclizine (ANTIVERT) 25 MG tablet Take 1 tablet (25 mg  total) by mouth 3 (three) times daily as needed for dizziness. 05/21/20   Alroy Bailiff, Margaux, PA-C  terbinafine (LAMISIL) 250 MG tablet Take 1 tablet (250 mg total) by mouth daily. 12/20/20   Evelina Dun A, FNP  triamcinolone cream (KENALOG) 0.1 % Apply 1 application topically 2 (two) times daily. 09/26/21   Mar Daring, PA-C  Vitamin D, Ergocalciferol, (DRISDOL) 1.25 MG (50000 UNIT) CAPS capsule Take 1 capsule (50,000 Units total) by mouth every 7 (seven) days. 06/24/20   Maximiano Coss, NP    Family History Family History  Problem Relation Age of Onset   Cancer Mother    Diabetes Mother    Multiple myeloma Father    Healthy Brother    Healthy Daughter    Healthy Son     Social History Social History   Tobacco Use   Smoking status: Never   Smokeless tobacco: Never  Substance Use Topics   Alcohol use: Yes    Comment: occ   Drug use: No     Allergies   Patient has no known allergies.   Review of Systems Review of Systems  Constitutional:  Negative for chills and fever.  HENT:  Positive for congestion and sore throat. Negative for ear pain.   Eyes:  Negative for pain and visual disturbance.  Respiratory:  Positive for cough. Negative for shortness of breath.  Cardiovascular:  Negative for chest pain and palpitations.  Gastrointestinal:  Negative for abdominal pain, nausea and vomiting.  Genitourinary:  Negative for dysuria and hematuria.  Musculoskeletal:  Negative for arthralgias and back pain.  Skin:  Negative for color change and rash.  Neurological:  Negative for seizures and syncope.  All other systems reviewed and are negative.   Physical Exam Triage Vital Signs ED Triage Vitals  Enc Vitals Group     BP 01/05/22 1540 (!) 141/82     Pulse Rate 01/05/22 1540 91     Resp 01/05/22 1540 18     Temp 01/05/22 1540 98.2 F (36.8 C)     Temp Source 01/05/22 1540 Oral     SpO2 01/05/22 1540 98 %     Weight --      Height --      Head Circumference --       Peak Flow --      Pain Score 01/05/22 1541 0     Pain Loc --      Pain Edu? --      Excl. in [place]? --    No data found.  Updated Vital Signs BP (!) 141/82 (BP Location: Left Arm)    Pulse 91    Temp 98.2 F (36.8 C) (Oral)    Resp 18    SpO2 98%   Visual Acuity Right Eye Distance:   Left Eye Distance:   Bilateral Distance:    Right Eye Near:   Left Eye Near:    Bilateral Near:     Physical Exam Vitals and nursing note reviewed.  Constitutional:      General: She is not in acute distress.    Appearance: She is well-developed.  HENT:     Head: Normocephalic and atraumatic.  Eyes:     Conjunctiva/sclera: Conjunctivae normal.  Cardiovascular:     Rate and Rhythm: Normal rate and regular rhythm.     Heart sounds: No murmur heard. Pulmonary:     Effort: Pulmonary effort is normal. No respiratory distress.     Breath sounds: Normal breath sounds.  Abdominal:     Palpations: Abdomen is soft.     Tenderness: There is no abdominal tenderness.  Musculoskeletal:        General: No swelling.     Cervical back: Neck supple.  Skin:    General: Skin is warm and dry.     Capillary Refill: Capillary refill takes less than 2 seconds.  Neurological:     Mental Status: She is alert.  Psychiatric:        Mood and Affect: Mood normal.     UC Treatments / Results  Labs (all labs ordered are listed, but only abnormal results are displayed) Labs Reviewed  NOVEL CORONAVIRUS, NAA    EKG   Radiology No results found.  Procedures Procedures (including critical care time)  Medications Ordered in UC Medications - No data to display  Initial Impression / Assessment and Plan / UC Course  I have reviewed the triage vital signs and the nursing notes.  Pertinent labs & imaging results that were available during my care of the patient were reviewed by me and considered in my medical decision making (see chart for details).     URI, COVID test pending.  Discussed supportive  care.  Pt overall well appearing, vitals wnl, stable for discharge.  Advised pt to start tessalon pearls.  Return precautions discussed.  Final Clinical Impressions(s) / UC Diagnoses  Final diagnoses:  Viral upper respiratory tract infection     Discharge Instructions      Nasal saline spray as needed Recommend Mucinex D and Delsym Take Tessalon as prescribed.  COVID test pending, will call with results.    ED Prescriptions   None    PDMP not reviewed this encounter.   Ward, Lenise Arena, PA-C 01/05/22 1650

## 2022-01-06 LAB — NOVEL CORONAVIRUS, NAA: SARS-CoV-2, NAA: NOT DETECTED

## 2022-01-13 ENCOUNTER — Telehealth: Payer: BC Managed Care – PPO | Admitting: Physician Assistant

## 2022-01-13 DIAGNOSIS — B9789 Other viral agents as the cause of diseases classified elsewhere: Secondary | ICD-10-CM | POA: Diagnosis not present

## 2022-01-13 DIAGNOSIS — J329 Chronic sinusitis, unspecified: Secondary | ICD-10-CM | POA: Diagnosis not present

## 2022-01-13 MED ORDER — IPRATROPIUM BROMIDE 0.03 % NA SOLN: 2.0000 | Freq: Two times a day (BID) | NASAL | 0 refills | Status: DC

## 2022-01-13 NOTE — Progress Notes (Signed)
duplicate

## 2022-01-13 NOTE — Progress Notes (Signed)

## 2022-02-17 ENCOUNTER — Telehealth: Payer: BC Managed Care – PPO | Admitting: Physician Assistant

## 2022-02-17 DIAGNOSIS — B351 Tinea unguium: Secondary | ICD-10-CM

## 2022-02-17 MED ORDER — CICLOPIROX 8 % EX SOLN: Freq: Every day | CUTANEOUS | 0 refills | Status: DC

## 2022-02-17 NOTE — Progress Notes (Signed)
E-Visit for Athlete's Foot ? ?We are sorry that you are not feeling well. Here is how we plan to help! ? ?Based on what you shared with me it looks like you likely have onychomycosis. This is a fungal infection that usually affects your toenails.   ? ?I am prescribing: Apply over nail and surrounding skin. Apply daily over previous coat. After seven (7) days, may remove with alcohol and continue cycle. ? ?HOME CARE: ? ?Keep feet clean, dry, and cool. ?Avoid using swimming pools, public showers, or foot baths. ?Wear sandals when possible or air shoes out by alternating them every 2-3 days. ?Avoid wearing closed shoes and wearing socks made from fabric that doesn?t dry easily (for example, nylon). ?Treat the infection with recommended medication ? ?GET HELP RIGHT AWAY IF: ? ?Symptoms that don?t go away after treatment. ?Severe itching that persists. ?If your rash spreads or swells. ?If your rash begins to have drainage or smell. ?You develop a fever. ? ?MAKE SURE YOU  ? ?Understand these instructions. ?Will watch your condition. ?Will get help right away if you are not doing well or get worse. ? ? ?Thank you for choosing an e-visit. ? ?Your e-visit answers were reviewed by a board certified advanced clinical practitioner to complete your personal care plan. Depending upon the condition, your plan could have included both over the counter or prescription medications. ? ?Please review your pharmacy choice. Make sure the pharmacy is open so you can pick up prescription now. If there is a problem, you may contact your provider through [employer] and have the prescription routed to another pharmacy. ? ?Your safety is important to [place]. If you have drug allergies check your prescription carefully.  ? ?For the next 24 hours you can use MyChart to ask questions about today?s visit, request a non-urgent call back, or ask for a work or school excuse. ? ?You will get an email in the next two days asking about your  experience. I hope that your e-visit has been valuable and will speed your recovery ? ? ? ?Approximately 5 minutes was spent documenting and reviewing patient's chart. ? ? ? ? ? ?

## 2022-03-24 DIAGNOSIS — R140 Abdominal distension (gaseous): Secondary | ICD-10-CM | POA: Diagnosis not present

## 2022-03-24 DIAGNOSIS — Z1211 Encounter for screening for malignant neoplasm of colon: Secondary | ICD-10-CM | POA: Diagnosis not present

## 2022-03-24 DIAGNOSIS — Z800 Family history of malignant neoplasm of digestive organs: Secondary | ICD-10-CM | POA: Diagnosis not present

## 2022-03-24 DIAGNOSIS — K219 Gastro-esophageal reflux disease without esophagitis: Secondary | ICD-10-CM | POA: Diagnosis not present

## 2022-04-01 ENCOUNTER — Ambulatory Visit: Payer: BC Managed Care – PPO | Admitting: Nurse Practitioner

## 2022-04-01 ENCOUNTER — Ambulatory Visit: Payer: BC Managed Care – PPO | Admitting: Registered Nurse

## 2022-04-01 ENCOUNTER — Encounter: Payer: Self-pay | Admitting: Registered Nurse

## 2022-04-01 VITALS — BP 126/83 | HR 82 | Temp 97.6°F | Resp 18 | Ht 65.0 in | Wt 211.8 lb

## 2022-04-01 DIAGNOSIS — E039 Hypothyroidism, unspecified: Secondary | ICD-10-CM

## 2022-04-01 LAB — TSH: TSH: 6.83 u[IU]/mL — ABNORMAL HIGH (ref 0.35–5.50)

## 2022-04-01 LAB — T4, FREE: Free T4: 0.8 ng/dL (ref 0.60–1.60)

## 2022-04-01 MED ORDER — LEVOTHYROXINE SODIUM 50 MCG PO TABS: 50.0000 ug | ORAL_TABLET | Freq: Every day | ORAL | 3 refills | Status: DC

## 2022-04-01 NOTE — Patient Instructions (Addendum)
[name] to see you! Let's get synthroid started. Daily in AM about an hour before any other food, meds, or beverages. Water ok.  Let's check labs today with plan to recheck in around 6 weeks.  Call sooner with concerns  Thank you,  Erika Lang     If you have lab work done today you will be contacted with your lab results within the next 2 weeks.  If you have not heard from [place] then please contact us. The fastest way to get your results is to register for My Chart.   IF you received an x-ray today, you will receive an invoice from [facility]. Please contact [facility] at [phone] with questions or concerns regarding your invoice.   IF you received labwork today, you will receive an invoice from [place]. Please contact [facility] at [phone] with questions or concerns regarding your invoice.   Our billing staff will not be able to assist you with questions regarding bills from these companies.  You will be contacted with the lab results as soon as they are available. The fastest way to get your results is to activate your My Chart account. Instructions are located on the last page of this paperwork. If you have not heard from [place] regarding the results in 2 weeks, please contact this office.

## 2022-04-01 NOTE — Progress Notes (Signed)
Established Patient Office Visit  Subjective:  Patient ID: Erika Lang, female    DOB: 09-14-1971  Age: 51 y.o. MRN: [id number]  CC:  Chief Complaint  Patient presents with   Thyroid Problem    Patient states she was suppose to go get a colonoscopy but her tsh was elevated. Patient has noticed her toes looking bad with fungus and have not been feeling like herself.    HPI Carma Dwiggins presents for thyroid  Seen at outside clinic for colon ca screen. Was noted to have TSH elevated to 5.8, otherwise cbc, cmp unremarkable Advised to follow up with PCP She does note some feeling "not like herself" - some anxiety, feeling down.  Feels like "every body function is off" - blurred vision, brittle nails, dry/itchy skin  BMI 35  She also notes nail fungus. Both feet. Has persisted despite OTC treatment and rx terbinafine  Did have a cough in January - negative for COVID, seen by urgent care and given tessalon pearls.  It eventually resolved after 1-2 weeks. She does have consistent heartburn, constipation, and a tough time losing weight  Outpatient Medications Prior to Visit  Medication Sig Dispense Refill   benzonatate (TESSALON) 100 MG capsule Take 1 capsule (100 mg total) by mouth 3 (three) times daily as needed for cough. (Patient not taking: Reported on 04/01/2022) 30 capsule 0   ciclopirox (PENLAC) 8 % solution Apply topically at bedtime. Apply over nail and surrounding skin. Apply daily over previous coat. After seven (7) days, may remove with alcohol and continue cycle. (Patient not taking: Reported on 04/01/2022) 6.6 mL 0   Clotrimazole 1 % OINT Apply 1 application topically in the morning and at bedtime. (Patient not taking: Reported on 04/01/2022) 56.7 g 0   escitalopram (LEXAPRO) 10 MG tablet Take 1 tablet (10 mg total) by mouth daily. (Patient not taking: Reported on 04/01/2022) 90 tablet 0   fluticasone (FLONASE) 50 MCG/ACT nasal spray Place 2 sprays into both nostrils daily.  (Patient not taking: Reported on 04/01/2022) 16 g 6   hydrOXYzine (ATARAX/VISTARIL) 25 MG tablet Take 1 tablet (25 mg total) by mouth 3 (three) times daily as needed. (Patient not taking: Reported on 04/01/2022) 60 tablet 0   ibuprofen (ADVIL,MOTRIN) 200 MG tablet Take 200 mg by mouth every 6 (six) hours as needed for moderate pain. (Patient not taking: Reported on 04/01/2022)     ipratropium (ATROVENT) 0.03 % nasal spray Place 2 sprays into both nostrils every 12 (twelve) hours. (Patient not taking: Reported on 04/01/2022) 30 mL 0   meclizine (ANTIVERT) 25 MG tablet Take 1 tablet (25 mg total) by mouth 3 (three) times daily as needed for dizziness. (Patient not taking: Reported on 04/01/2022) 30 tablet 0   terbinafine (LAMISIL) 250 MG tablet Take 1 tablet (250 mg total) by mouth daily. (Patient not taking: Reported on 04/01/2022) 90 tablet 0   triamcinolone cream (KENALOG) 0.1 % Apply 1 application topically 2 (two) times daily. (Patient not taking: Reported on 04/01/2022) 30 g 0   Vitamin D, Ergocalciferol, (DRISDOL) 1.25 MG (50000 UNIT) CAPS capsule Take 1 capsule (50,000 Units total) by mouth every 7 (seven) days. (Patient not taking: Reported on 04/01/2022) 8 capsule 0   No facility-administered medications prior to visit.    Review of Systems Per hpi    Objective:     BP 126/83   Pulse 82   Temp 97.6 F (36.4 C) (Temporal)   Resp 18   Ht 5\' 5"  (1.651  m)   Wt 211 lb 12.8 oz (96.1 kg)   SpO2 100%   BMI 35.25 kg/m   Wt Readings from Last 3 Encounters:  04/01/22 211 lb 12.8 oz (96.1 kg)  06/10/20 (!) 209 lb (94.8 kg)  05/21/20 200 lb (90.7 kg)   Physical Exam Constitutional:      General: She is not in acute distress.    Appearance: Normal appearance. She is normal weight. She is not ill-appearing, toxic-appearing or diaphoretic.  HENT:     Head: Normocephalic and atraumatic.  Cardiovascular:     Pulses: Normal pulses.     Heart sounds: Normal heart sounds.  Pulmonary:      Effort: Pulmonary effort is normal.     Breath sounds: Normal breath sounds.  Musculoskeletal:     Cervical back: Normal range of motion and neck supple. No rigidity or tenderness.  Lymphadenopathy:     Cervical: No cervical adenopathy.  Skin:    Coloration: Skin is not jaundiced or pale.     Findings: No bruising, erythema, lesion or rash.  Neurological:     Mental Status: She is alert.  Psychiatric:        Mood and Affect: Mood normal.        Behavior: Behavior normal.        Thought Content: Thought content normal.        Judgment: Judgment normal.    No results found for any visits on 04/01/22.    The ASCVD Risk score (Arnett DK, et al., 2019) failed to calculate for the following reasons:   Cannot find a previous HDL lab   Cannot find a previous total cholesterol lab    Assessment & Plan:   Problem List Items Addressed This Visit   None Visit Diagnoses     Hypothyroidism, unspecified type    -  Primary   Relevant Medications   levothyroxine (SYNTHROID) 50 MCG tablet   Other Relevant Orders   TSH   T4, free   Antinuclear Antib (ANA)       Meds ordered this encounter  Medications   levothyroxine (SYNTHROID) 50 MCG tablet    Sig: Take 1 tablet (50 mcg total) by mouth daily.    Dispense:  90 tablet    Refill:  3    Order Specific Question:   Supervising Provider    Answer:   Neva Seat, JEFFREY R [[id number]]    Return in about 6 weeks (around 05/13/2022) for Chronic Conditions, med check.   PLAN Start synthroid po qd Labs collected. Will follow up with the patient as warranted. Recheck in 6 weeks Patient encouraged to call clinic with any questions, comments, or concerns.    Janeece Agee, NP

## 2022-04-03 LAB — ANA: Anti Nuclear Antibody (ANA): POSITIVE — AB

## 2022-04-03 LAB — ANTI-NUCLEAR AB-TITER (ANA TITER): ANA Titer 1: 1:80 {titer} — ABNORMAL HIGH

## 2022-04-16 DIAGNOSIS — K5730 Diverticulosis of large intestine without perforation or abscess without bleeding: Secondary | ICD-10-CM | POA: Diagnosis not present

## 2022-04-16 DIAGNOSIS — Z1211 Encounter for screening for malignant neoplasm of colon: Secondary | ICD-10-CM | POA: Diagnosis not present

## 2022-04-16 DIAGNOSIS — K635 Polyp of colon: Secondary | ICD-10-CM | POA: Diagnosis not present

## 2022-04-16 DIAGNOSIS — D123 Benign neoplasm of transverse colon: Secondary | ICD-10-CM | POA: Diagnosis not present

## 2022-04-20 ENCOUNTER — Encounter: Payer: Self-pay | Admitting: Registered Nurse

## 2022-05-07 ENCOUNTER — Encounter: Payer: Self-pay | Admitting: Registered Nurse

## 2022-05-07 ENCOUNTER — Ambulatory Visit: Payer: BC Managed Care – PPO | Admitting: Registered Nurse

## 2022-05-07 VITALS — BP 134/82 | HR 87 | Temp 97.6°F | Resp 18 | Ht 65.0 in | Wt 215.8 lb

## 2022-05-07 DIAGNOSIS — E559 Vitamin D deficiency, unspecified: Secondary | ICD-10-CM

## 2022-05-07 DIAGNOSIS — E039 Hypothyroidism, unspecified: Secondary | ICD-10-CM | POA: Diagnosis not present

## 2022-05-07 DIAGNOSIS — R5382 Chronic fatigue, unspecified: Secondary | ICD-10-CM

## 2022-05-07 DIAGNOSIS — R202 Paresthesia of skin: Secondary | ICD-10-CM | POA: Diagnosis not present

## 2022-05-07 LAB — CBC WITH DIFFERENTIAL/PLATELET
Basophils Absolute: 0.1 10*3/uL (ref 0.0–0.1)
Basophils Relative: 0.9 % (ref 0.0–3.0)
Eosinophils Absolute: 0.2 10*3/uL (ref 0.0–0.7)
Eosinophils Relative: 3.1 % (ref 0.0–5.0)
HCT: 37.3 % (ref 36.0–46.0)
Hemoglobin: 12.3 g/dL (ref 12.0–15.0)
Lymphocytes Relative: 22.4 % (ref 12.0–46.0)
Lymphs Abs: 1.7 10*3/uL (ref 0.7–4.0)
MCHC: 32.9 g/dL (ref 30.0–36.0)
MCV: 87.3 fl (ref 78.0–100.0)
Monocytes Absolute: 0.5 10*3/uL (ref 0.1–1.0)
Monocytes Relative: 7.1 % (ref 3.0–12.0)
Neutro Abs: 5.1 10*3/uL (ref 1.4–7.7)
Neutrophils Relative %: 66.5 % (ref 43.0–77.0)
Platelets: 327 10*3/uL (ref 150.0–400.0)
RBC: 4.27 Mil/uL (ref 3.87–5.11)
RDW: 14.3 % (ref 11.5–15.5)
WBC: 7.7 10*3/uL (ref 4.0–10.5)

## 2022-05-07 LAB — COMPREHENSIVE METABOLIC PANEL
ALT: 23 U/L (ref 0–35)
AST: 21 U/L (ref 0–37)
Albumin: 4.3 g/dL (ref 3.5–5.2)
Alkaline Phosphatase: 69 U/L (ref 39–117)
BUN: 14 mg/dL (ref 6–23)
CO2: 26 mEq/L (ref 19–32)
Calcium: 9.1 mg/dL (ref 8.4–10.5)
Chloride: 104 mEq/L (ref 96–112)
Creatinine, Ser: 0.78 mg/dL (ref 0.40–1.20)
GFR: 88.92 mL/min (ref >60.00)
Glucose, Bld: 104 mg/dL — ABNORMAL HIGH (ref 70–99)
Potassium: 4 mEq/L (ref 3.5–5.1)
Sodium: 137 mEq/L (ref 135–145)
Total Bilirubin: 1 mg/dL (ref 0.2–1.2)
Total Protein: 7.7 g/dL (ref 6.0–8.3)

## 2022-05-07 LAB — B12 AND FOLATE PANEL
Folate: 11.4 ng/mL (ref >5.9)
Vitamin B-12: 313 pg/mL (ref 211–911)

## 2022-05-07 LAB — T4, FREE: Free T4: 0.93 ng/dL (ref 0.60–1.60)

## 2022-05-07 LAB — TSH: TSH: 4.04 u[IU]/mL (ref 0.35–5.50)

## 2022-05-07 LAB — VITAMIN D 25 HYDROXY (VIT D DEFICIENCY, FRACTURES): VITD: 28.04 ng/mL — ABNORMAL LOW (ref 30.00–100.00)

## 2022-05-07 LAB — HEMOGLOBIN A1C: Hgb A1c MFr Bld: 5.7 % (ref 4.6–6.5)

## 2022-05-07 NOTE — Assessment & Plan Note (Signed)
Unclear etiology. Labs collected. Will follow up with the patient as warranted.

## 2022-05-07 NOTE — Progress Notes (Signed)
Established Patient Office Visit  Subjective:  Patient ID: Erika Lang, female    DOB: [birth year]  Age: 51 y.o. MRN: [id number]  CC:  Chief Complaint  Patient presents with   Follow-up    Patient state she is here for a follow up and discuss medications.    HPI Erika Lang presents for follow up   She notes that restarting synthroid did not change much -  Still experiencing some weight gain, fatigue.  She does have intermittent paresthesias in arms and legs. No clear pattern.  She declines anxiety and depression affecting symptoms. Feel stable from a mental health perspective at this time.   Denies headaches, acute visual changes, chest pain, palpitations, claudication, dependent edema.   Outpatient Medications Prior to Visit  Medication Sig Dispense Refill   levothyroxine (SYNTHROID) 50 MCG tablet Take 1 tablet (50 mcg total) by mouth daily. 90 tablet 3   Vitamin D, Ergocalciferol, (DRISDOL) 1.25 MG (50000 UNIT) CAPS capsule Take 1 capsule (50,000 Units total) by mouth every 7 (seven) days. (Patient not taking: Reported on 04/01/2022) 8 capsule 0   benzonatate (TESSALON) 100 MG capsule Take 1 capsule (100 mg total) by mouth 3 (three) times daily as needed for cough. (Patient not taking: Reported on 04/01/2022) 30 capsule 0   ciclopirox (PENLAC) 8 % solution Apply topically at bedtime. Apply over nail and surrounding skin. Apply daily over previous coat. After seven (7) days, may remove with alcohol and continue cycle. (Patient not taking: Reported on 04/01/2022) 6.6 mL 0   Clotrimazole 1 % OINT Apply 1 application topically in the morning and at bedtime. (Patient not taking: Reported on 04/01/2022) 56.7 g 0   escitalopram (LEXAPRO) 10 MG tablet Take 1 tablet (10 mg total) by mouth daily. (Patient not taking: Reported on 04/01/2022) 90 tablet 0   fluticasone (FLONASE) 50 MCG/ACT nasal spray Place 2 sprays into both nostrils daily. (Patient not taking: Reported on 04/01/2022) 16 g 6    hydrOXYzine (ATARAX/VISTARIL) 25 MG tablet Take 1 tablet (25 mg total) by mouth 3 (three) times daily as needed. (Patient not taking: Reported on 04/01/2022) 60 tablet 0   ibuprofen (ADVIL,MOTRIN) 200 MG tablet Take 200 mg by mouth every 6 (six) hours as needed for moderate pain. (Patient not taking: Reported on 04/01/2022)     ipratropium (ATROVENT) 0.03 % nasal spray Place 2 sprays into both nostrils every 12 (twelve) hours. (Patient not taking: Reported on 04/01/2022) 30 mL 0   meclizine (ANTIVERT) 25 MG tablet Take 1 tablet (25 mg total) by mouth 3 (three) times daily as needed for dizziness. (Patient not taking: Reported on 04/01/2022) 30 tablet 0   terbinafine (LAMISIL) 250 MG tablet Take 1 tablet (250 mg total) by mouth daily. (Patient not taking: Reported on 04/01/2022) 90 tablet 0   triamcinolone cream (KENALOG) 0.1 % Apply 1 application topically 2 (two) times daily. (Patient not taking: Reported on 04/01/2022) 30 g 0   No facility-administered medications prior to visit.    Review of Systems  Constitutional: Negative.   HENT: Negative.    Eyes: Negative.   Respiratory: Negative.    Cardiovascular: Negative.   Gastrointestinal: Negative.   Genitourinary: Negative.   Musculoskeletal: Negative.   Skin: Negative.   Neurological: Negative.   Psychiatric/Behavioral: Negative.    All other systems reviewed and are negative.     Objective:     BP 134/82   Pulse 87   Temp 97.6 F (36.4 C) (Temporal)   Resp 18  Ht 5\' 5"  (1.651 m)   Wt 215 lb 12.8 oz (97.9 kg)   SpO2 99%   BMI 35.91 kg/m   Wt Readings from Last 3 Encounters:  05/07/22 215 lb 12.8 oz (97.9 kg)  04/01/22 211 lb 12.8 oz (96.1 kg)  06/10/20 (!) 209 lb (94.8 kg)   Physical Exam Vitals and nursing note reviewed.  Constitutional:      General: She is not in acute distress.    Appearance: Normal appearance. She is normal weight. She is not ill-appearing, toxic-appearing or diaphoretic.  Cardiovascular:     Rate  and Rhythm: Normal rate and regular rhythm.     Heart sounds: Normal heart sounds. No murmur heard.    No friction rub. No gallop.  Pulmonary:     Effort: Pulmonary effort is normal. No respiratory distress.     Breath sounds: Normal breath sounds. No stridor. No wheezing, rhonchi or rales.  Chest:     Chest wall: No tenderness.  Skin:    General: Skin is warm and dry.  Neurological:     General: No focal deficit present.     Mental Status: She is alert and oriented to person, place, and time. Mental status is at baseline.  Psychiatric:        Mood and Affect: Mood normal.        Behavior: Behavior normal.        Thought Content: Thought content normal.        Judgment: Judgment normal.     No results found for any visits on 05/07/22.    The ASCVD Risk score (Arnett DK, et al., 2019) failed to calculate for the following reasons:   Cannot find a previous HDL lab   Cannot find a previous total cholesterol lab    Assessment & Plan:   Problem List Items Addressed This Visit       Endocrine   Hypothyroidism - Primary    Clinically suspicious for thyroid dysfunction, will check labs and treat as indicated.       Relevant Orders   TSH   T4, free     Other   Vitamin D deficiency    Labs collected. Will follow up with the patient as warranted.       Relevant Orders   Vitamin D (25 hydroxy)   Chronic fatigue    Unclear etiology. Labs collected. Will follow up with the patient as warranted.       Relevant Orders   CBC with Differential/Platelet   Comprehensive metabolic panel   Hemoglobin A1c   Paresthesia    Unclear etiology. Will check A1c, B12/folate, and Vitamin D. Treat as indicated.       Relevant Orders   B12 and Folate Panel    No orders of the defined types were placed in this encounter.   Return in about 3 months (around 08/07/2022) for Chronic Conditions.    08/09/2022, NP

## 2022-05-07 NOTE — Assessment & Plan Note (Signed)
Unclear etiology. Will check A1c, B12/folate, and Vitamin D. Treat as indicated.

## 2022-05-07 NOTE — Patient Instructions (Addendum)
Ms. Arraya Buck to see you  Sorry things are not improving as we anticipated.  Let's broaden our scope on labs and see what we can do to help you feel better.  I will be in touch with results this afternoon.  We can plan on 3 month follow up to recheck  Call sooner if you need anything  Thank you,  Rich     If you have lab work done today you will be contacted with your lab results within the next 2 weeks.  If you have not heard from [place] then please contact us. The fastest way to get your results is to register for My Chart.   IF you received an x-ray today, you will receive an invoice from [facility]. Please contact [facility] at [phone] with questions or concerns regarding your invoice.   IF you received labwork today, you will receive an invoice from [facility]. Please contact [facility] at [phone] with questions or concerns regarding your invoice.   Our billing staff will not be able to assist you with questions regarding bills from these companies.  You will be contacted with the lab results as soon as they are available. The fastest way to get your results is to activate your My Chart account. Instructions are located on the last page of this paperwork. If you have not heard from [place] regarding the results in 2 weeks, please contact this office.

## 2022-05-07 NOTE — Assessment & Plan Note (Signed)
Labs collected. Will follow up with the patient as warranted.  

## 2022-05-07 NOTE — Assessment & Plan Note (Signed)
Clinically suspicious for thyroid dysfunction, will check labs and treat as indicated.

## 2022-05-07 NOTE — Telephone Encounter (Signed)
Lab results pt is asking about metformin as potential prediabetes treatment

## 2022-05-19 ENCOUNTER — Ambulatory Visit: Payer: BC Managed Care – PPO | Admitting: Registered Nurse

## 2022-05-20 ENCOUNTER — Encounter: Payer: Self-pay | Admitting: Registered Nurse

## 2022-06-02 ENCOUNTER — Ambulatory Visit (INDEPENDENT_AMBULATORY_CARE_PROVIDER_SITE_OTHER): Payer: BC Managed Care – PPO | Admitting: Family Medicine

## 2022-06-02 ENCOUNTER — Encounter: Payer: Self-pay | Admitting: Family Medicine

## 2022-06-02 VITALS — BP 126/84 | HR 75 | Temp 97.5°F | Ht 64.5 in | Wt 212.0 lb

## 2022-06-02 DIAGNOSIS — Z6835 Body mass index (BMI) 35.0-35.9, adult: Secondary | ICD-10-CM

## 2022-06-02 DIAGNOSIS — R5382 Chronic fatigue, unspecified: Secondary | ICD-10-CM | POA: Diagnosis not present

## 2022-06-02 DIAGNOSIS — R768 Other specified abnormal immunological findings in serum: Secondary | ICD-10-CM | POA: Diagnosis not present

## 2022-06-02 NOTE — Progress Notes (Addendum)
Assessment/Plan:   Problem List Items Addressed This Visit       Other   Chronic fatigue    Extensive work-up as outlined in HPI Positive ANA with speckled pattern, SLE is a possibility Snoring, and BMI of 35, dispose patient for OSA Referral to rheumatology Sleep study Patient follow-up in 2 months      Relevant Orders   Ambulatory referral to Sleep Studies   Other Visit Diagnoses     Positive ANA (antinuclear antibody)    -  Primary   Relevant Orders   Ambulatory referral to Rheumatology   BMI 35.0-35.9,adult       Relevant Orders   Ambulatory referral to Sleep Studies          Subjective:  HPI:  Erika Lang is a 51 y.o. female who has Vitamin D deficiency; Hypothyroidism; Chronic fatigue; and Paresthesia on their problem list..   She  has a past medical history of Anemia (May 2021) and Anxiety ([id number])..   She presents with chief complaint of Establish Care (Thyroid level concerns. Faxing ROI for pap results from [facility].) .  Patient reports ongoing symptoms of fatigue. Snoring,  Fatigue  She reports chronic fatigue which she describes as feeling exhausted. It began about a year ago and occurs all the time. It is described as moderate and staying constant. She has not started new medications around the time the fatigue started.     Associated symptoms: Yes arthralgias and pain in knees No bleeding  No melena No chest discomfort  No heart palpitations No heart racing   No dyspnea No feeling depressed  No feeling anxious or under stress No fevers  No loss of appetite No nausea  No vomiting No sleeping problems   Patient is at stable weight.  Patient says she tries to workout and eat a low-carb diet.  Patient with extensive workout in the past month that is positive for ANA with speckled pattern consistent with connective tissue disorder or lupus.  Patient did have a mildly elevated TSH around 5, this was approximately 1 month ago.   Patient was started on levothyroxine 50 mcg.  Has not noticed any difference in her symptoms although TSH did normalize to 4.  Patient mildly low vitamin D around 28.  Patient had mildly elevated hemoglobin A1c at 5.7.  Other unremarkable lab work included CBC, [place], CMP.  PHQ-9 and GAD-7 were negative today for depression anxiety.  Patient does report history of snoring.  Denies any known apneic events.  Wt Readings from Last 3 Encounters:  06/02/22 212 lb (96.2 kg)  05/07/22 215 lb 12.8 oz (97.9 kg)  04/01/22 211 lb 12.8 oz (96.1 kg)    Lab Results  Component Value Date   WBC 7.7 05/07/2022   HGB 12.3 05/07/2022   HCT 37.3 05/07/2022   MCV 87.3 05/07/2022   PLT 327.0 05/07/2022   Lab Results  Component Value Date   TSH 4.04 05/07/2022   Lab Results  Component Value Date   NA 137 05/07/2022   K 4.0 05/07/2022   CO2 26 05/07/2022   BUN 14 05/07/2022   CREATININE 0.78 05/07/2022   CALCIUM 9.1 05/07/2022   GLUCOSE 104 (H) 05/07/2022     ---------------------------------------------------------------------------------------------------   Past Surgical History:  Procedure Laterality Date   TUBAL LIGATION  Dec2003    Outpatient Medications Prior to Visit  Medication Sig Dispense Refill   levothyroxine (SYNTHROID) 50 MCG tablet Take 1 tablet (50 mcg total) by  mouth daily. 90 tablet 3   Vitamin D, Ergocalciferol, (DRISDOL) 1.25 MG (50000 UNIT) CAPS capsule Take 1 capsule (50,000 Units total) by mouth every 7 (seven) days. (Patient not taking: Reported on 04/01/2022) 8 capsule 0   No facility-administered medications prior to visit.    Family History  Problem Relation Age of Onset   Diabetes Mother    Breast cancer Mother    Multiple myeloma Father    Healthy Brother    Thyroid disease Paternal Grandmother    Healthy Daughter    Healthy Son    Early death Maternal Grandmother     Social History   Socioeconomic History   Marital status: Married    Spouse  name: Not on file   Number of children: Not on file   Years of education: Not on file   Highest education level: Not on file  Occupational History   Not on file  Tobacco Use   Smoking status: Never   Smokeless tobacco: Never  Substance and Sexual Activity   Alcohol use: Yes    Comment: occ   Drug use: No   Sexual activity: Yes  Other Topics Concern   Not on file  Social History Narrative   Not on file   Social Determinants of Health   Financial Resource Strain: Not on file  Food Insecurity: Not on file  Transportation Needs: Not on file  Physical Activity: Not on file  Stress: Not on file  Social Connections: Not on file  Intimate Partner Violence: Not on file                                                                                                 Objective:  Physical Exam: BP 126/84 (BP Location: Left Arm, Patient Position: Sitting, Cuff Size: Large)   Pulse 75   Temp (!) 97.5 F (36.4 C) (Temporal)   Ht 5' 4.5" (1.638 m)   Wt 212 lb (96.2 kg)   SpO2 98%   BMI 35.83 kg/m    General: No acute distress. Awake and conversant.  Eyes: Normal conjunctiva, anicteric. Round symmetric pupils.  ENT: Hearing grossly intact. No nasal discharge.  Neck: Neck is supple. No masses or thyromegaly.  Respiratory: Respirations are non-labored. No auditory wheezing.  Skin: Warm. No rashes or ulcers.  Psych: Alert and oriented. Cooperative, Appropriate mood and affect, Normal judgment.  CV: No cyanosis or JVD MSK: Normal ambulation. No clubbing  Neuro: Sensation and CN II-XII grossly normal.        Alesia Banda, MD, MS

## 2022-06-02 NOTE — Patient Instructions (Signed)
We are referring to rheumatology for positive ANA We are referring to sleep study

## 2022-06-02 NOTE — Assessment & Plan Note (Signed)
Extensive work-up as outlined in HPI Positive ANA with speckled pattern, SLE is a possibility Snoring, and BMI of 35, dispose patient for OSA Referral to rheumatology Sleep study Patient follow-up in 2 months

## 2022-06-03 NOTE — Telephone Encounter (Signed)
This concern has been previously addressed by myself and/or another provider.  If they patient has ongoing concerns, they can contact me at their convenience.  Thank you,  Rich Casmere Hollenbeck, NP 

## 2022-07-03 ENCOUNTER — Telehealth: Payer: Self-pay | Admitting: Family Medicine

## 2022-07-03 NOTE — Telephone Encounter (Signed)
Erika Lang  -Patient scheduled new patient appointment with Dulce Sellar but is a patient of Fanny Bien at [facility].   - Called patient to explain TOC process but due to N/A LVM  - Appointment with Dulce Sellar has been canceled.

## 2022-07-03 NOTE — Telephone Encounter (Signed)
Pt was a Erika Lang patient at [facility]. Pt states she would like to switch primary care from Dr. Fanny Bien at [facility] to Dr. Lutricia Horsfall at [facility].  Pt states she would like to stay in the [place] area.    Approve or Decline   Please route to [facility] Admin pool for follow up.

## 2022-07-15 NOTE — Telephone Encounter (Signed)
Patient called regarding status of Transfer of Care Request from Dr. Fanny Bien at [facility] to Dr. Lutricia Horsfall at [facility] at [facility].  Approve or Decline     Please route to [facility] Admin pool for follow up.

## 2022-07-16 ENCOUNTER — Ambulatory Visit: Payer: BC Managed Care – PPO | Admitting: Family

## 2022-07-16 ENCOUNTER — Telehealth: Payer: BC Managed Care – PPO | Admitting: Physician Assistant

## 2022-07-16 DIAGNOSIS — R3989 Other symptoms and signs involving the genitourinary system: Secondary | ICD-10-CM

## 2022-07-16 MED ORDER — CEPHALEXIN 500 MG PO CAPS: 500.0000 mg | ORAL_CAPSULE | Freq: Two times a day (BID) | ORAL | 0 refills | Status: AC

## 2022-07-16 NOTE — Progress Notes (Signed)

## 2022-07-16 NOTE — Progress Notes (Signed)
I have spent 5 minutes in review of e-visit questionnaire, review and updating patient chart, medical decision making and response to patient.   [place] Joshi Cody Aliegha Paullin, PA-C    

## 2022-07-21 ENCOUNTER — Ambulatory Visit: Payer: BC Managed Care – PPO | Admitting: Family Medicine

## 2022-08-03 ENCOUNTER — Ambulatory Visit: Payer: BC Managed Care – PPO | Admitting: Family Medicine

## 2022-08-04 ENCOUNTER — Ambulatory Visit: Payer: BC Managed Care – PPO | Admitting: Family Medicine

## 2022-08-04 ENCOUNTER — Encounter: Payer: Self-pay | Admitting: Family Medicine

## 2022-08-04 VITALS — BP 140/100 | HR 87 | Temp 97.2°F | Ht 64.5 in | Wt 211.4 lb

## 2022-08-04 DIAGNOSIS — E559 Vitamin D deficiency, unspecified: Secondary | ICD-10-CM | POA: Diagnosis not present

## 2022-08-04 DIAGNOSIS — L2084 Intrinsic (allergic) eczema: Secondary | ICD-10-CM | POA: Diagnosis not present

## 2022-08-04 DIAGNOSIS — E038 Other specified hypothyroidism: Secondary | ICD-10-CM

## 2022-08-04 DIAGNOSIS — E538 Deficiency of other specified B group vitamins: Secondary | ICD-10-CM

## 2022-08-04 DIAGNOSIS — E039 Hypothyroidism, unspecified: Secondary | ICD-10-CM

## 2022-08-04 DIAGNOSIS — R030 Elevated blood-pressure reading, without diagnosis of hypertension: Secondary | ICD-10-CM

## 2022-08-04 MED ORDER — LEVOTHYROXINE SODIUM 75 MCG PO TABS: 75.0000 ug | ORAL_TABLET | Freq: Every day | ORAL | 1 refills | Status: DC

## 2022-08-04 MED ORDER — TRIAMCINOLONE ACETONIDE 0.1 % EX CREA: 1.0000 | TOPICAL_CREAM | Freq: Two times a day (BID) | CUTANEOUS | 0 refills | Status: AC

## 2022-08-04 NOTE — Patient Instructions (Addendum)
It was very nice to see you today!  Vitamin d 1000 iu/d Vitamin B12 1054mcg/day  [place]-check insurance.   Monitor blood pressure  Increasing synthroid to 75-repeat labs 2 months.     PLEASE NOTE:  If you had any lab tests please let us know if you have not heard back within a few days. You may see your results on MyChart before we have a chance to review them but we will give you a call once they are reviewed by [place]. If we ordered any referrals today, please let us know if you have not heard from their office within the next week.   Please try these tips to maintain a healthy lifestyle:  Eat most of your calories during the day when you are active. Eliminate processed foods including packaged sweets (pies, cakes, cookies), reduce intake of potatoes, white bread, white pasta, and white rice. Look for whole grain options, oat flour or almond flour.  Each meal should contain half fruits/vegetables, one quarter protein, and one quarter carbs (no bigger than a computer mouse).  Cut down on sweet beverages. This includes juice, soda, and sweet tea. Also watch fruit intake, though this is a healthier sweet option, it still contains natural sugar! Limit to 3 servings daily.  Drink at least 1 glass of water with each meal and aim for at least 8 glasses per day  Exercise at least 150 minutes every week.

## 2022-08-04 NOTE — Progress Notes (Signed)
Subjective:     Patient ID: Erika Lang, female    DOB: 1971-05-15, 51 y.o.   MRN: [id number]  Chief Complaint  Patient presents with   Transitions Of Care    Thyroid f/u     HPI  Fatigue for 2 yrs-D a little low and B12. A1C 5.7.  2 yrs ago, dizzy, shakey, sob so went to ER-ekg, etc fine and told get PCP.  Placed on Lexapro thinking anxiety and got more nervous so didn't take.  This yr in May-cscope and Dr. Collene Mares did labs and told thyroid off so saw pcp and elevated. Started synthroid-no change.   Voice had been hoarse 2 yrs ago and neck itchy and lot wt.  But then gained wt more.  Hypothyroidism-tsh 4 on 6/29. On synthroid 50. Taking correctly.  Rashes on feet and legs intermitt-despite moisturizers.  PreDM-walks daily, working on diet.  Drinking water w/crystal lite.    Health Maintenance Due  Topic Date Due   COVID-19 Vaccine (1) Never done    Past Medical History:  Diagnosis Date   Anemia May 2021   Anxiety [id number]    Past Surgical History:  Procedure Laterality Date   TUBAL LIGATION  Dec2003    Outpatient Medications Prior to Visit  Medication Sig Dispense Refill   levothyroxine (SYNTHROID) 50 MCG tablet Take 1 tablet (50 mcg total) by mouth daily. 90 tablet 3   Vitamin D, Ergocalciferol, (DRISDOL) 1.25 MG (50000 UNIT) CAPS capsule Take 1 capsule (50,000 Units total) by mouth every 7 (seven) days. (Patient not taking: Reported on 04/01/2022) 8 capsule 0   No facility-administered medications prior to visit.    No Known Allergies ROS neg/noncontributory except as noted HPI/below Blurry vision Urinates a lot at night.        Objective:     BP (!) 140/100   Pulse 87   Temp (!) 97.2 F (36.2 C) (Temporal)   Ht 5' 4.5" (1.638 m)   Wt 211 lb 6.4 oz (95.9 kg)   SpO2 97%   BMI 35.73 kg/m  Wt Readings from Last 3 Encounters:  08/04/22 211 lb 6.4 oz (95.9 kg)  06/02/22 212 lb (96.2 kg)  05/07/22 215 lb 12.8 oz (97.9 kg)    Physical Exam   Gen: WDWN  NAD HEENT: NCAT, conjunctiva not injected, sclera nonicteric NECK:  supple, no thyromegaly, no nodes, no carotid bruits CARDIAC: RRR, S1S2+, no murmur. DP 2+B LUNGS: CTAB. No wheezes ABDOMEN:  BS+, soft, NTND, No HSM, no masses EXT:  no edema MSK: no gross abnormalities.  NEURO: A&O x3.  CN II-XII intact.  PSYCH: normal mood. Good eye contact Skin-dorsum of feet and R lower leg lateral to under knee-pink, irrit, rough skin-no scale.    Reviewed labs, notes., education,discussing plan, etc-total 75min     Assessment & Plan:   Problem List Items Addressed This Visit       Endocrine   Hypothyroidism - Primary   Relevant Medications   levothyroxine (SYNTHROID) 75 MCG tablet     Other   Vitamin D deficiency   Other Visit Diagnoses     Intrinsic eczema       Elevated blood pressure reading       Vitamin B12 deficiency          Hypothyroidism-chronic, not ideal-discussed may be from virus that caused hyper and now hypo.  May never feel "normal", but meds may be making a difference.  Will inc dose synthroid to 0.075 and reck  6 wks.   Vit D and B12 deficiency-take 1000 iu d and 1000 mcg B12 -reck next ov Rash-eczema vs psoriasis vs other-will do triamcinolone cram 0.1% w/moisturizer bid.  Pt to f/u Derm.   Elevated bp reading-work on diet/exercise/low salt.  Monitor if can.  F/u 6 wks.   Obesity-pt working on diet/exercise w/o success-check ins on [employer].   Meds ordered this encounter  Medications   levothyroxine (SYNTHROID) 75 MCG tablet    Sig: Take 1 tablet (75 mcg total) by mouth daily.    Dispense:  90 tablet    Refill:  1   triamcinolone cream (KENALOG) 0.1 %    Sig: Apply 1 Application topically 2 (two) times daily.    Dispense:  30 g    Refill:  0    Angelena Sole, MD

## 2022-08-05 ENCOUNTER — Other Ambulatory Visit: Payer: Self-pay | Admitting: Family Medicine

## 2022-08-05 DIAGNOSIS — Z6835 Body mass index (BMI) 35.0-35.9, adult: Secondary | ICD-10-CM

## 2022-08-05 MED ORDER — WEGOVY 0.5 MG/0.5ML ~~LOC~~ SOAJ: 0.5000 mg | SUBCUTANEOUS | 0 refills | Status: DC

## 2022-09-01 ENCOUNTER — Other Ambulatory Visit: Payer: Self-pay | Admitting: Family Medicine

## 2022-09-01 MED ORDER — SAXENDA 18 MG/3ML ~~LOC~~ SOPN: PEN_INJECTOR | SUBCUTANEOUS | 0 refills | Status: DC

## 2022-09-15 ENCOUNTER — Encounter: Payer: Self-pay | Admitting: Family Medicine

## 2022-09-15 ENCOUNTER — Ambulatory Visit: Payer: BC Managed Care – PPO | Admitting: Family Medicine

## 2022-09-15 VITALS — BP 122/82 | HR 88 | Temp 98.2°F | Ht 64.5 in | Wt 211.4 lb

## 2022-09-15 DIAGNOSIS — Z6835 Body mass index (BMI) 35.0-35.9, adult: Secondary | ICD-10-CM

## 2022-09-15 DIAGNOSIS — R030 Elevated blood-pressure reading, without diagnosis of hypertension: Secondary | ICD-10-CM

## 2022-09-15 DIAGNOSIS — E038 Other specified hypothyroidism: Secondary | ICD-10-CM

## 2022-09-15 DIAGNOSIS — N951 Menopausal and female climacteric states: Secondary | ICD-10-CM

## 2022-09-15 LAB — TSH: TSH: 1.4 u[IU]/mL (ref 0.35–5.50)

## 2022-09-15 MED ORDER — PHENTERMINE HCL 37.5 MG PO CAPS: 37.5000 mg | ORAL_CAPSULE | ORAL | 0 refills | Status: DC

## 2022-09-15 MED ORDER — GABAPENTIN 100 MG PO CAPS: 100.0000 mg | ORAL_CAPSULE | Freq: Three times a day (TID) | ORAL | 3 refills | Status: DC

## 2022-09-15 NOTE — Patient Instructions (Signed)
It was very nice to see you today!  Take the B complex vitamin.    PLEASE NOTE:  If you had any lab tests please let us know if you have not heard back within a few days. You may see your results on MyChart before we have a chance to review them but we will give you a call once they are reviewed by [place]. If we ordered any referrals today, please let us know if you have not heard from their office within the next week.   Please try these tips to maintain a healthy lifestyle:  Eat most of your calories during the day when you are active. Eliminate processed foods including packaged sweets (pies, cakes, cookies), reduce intake of potatoes, white bread, white pasta, and white rice. Look for whole grain options, oat flour or almond flour.  Each meal should contain half fruits/vegetables, one quarter protein, and one quarter carbs (no bigger than a computer mouse).  Cut down on sweet beverages. This includes juice, soda, and sweet tea. Also watch fruit intake, though this is a healthier sweet option, it still contains natural sugar! Limit to 3 servings daily.  Drink at least 1 glass of water with each meal and aim for at least 8 glasses per day  Exercise at least 150 minutes every week.

## 2022-09-15 NOTE — Progress Notes (Signed)
Subjective:     Patient ID: Erika Lang, female    DOB: Aug 01, 1971, 50 y.o.   MRN: [id number]  Chief Complaint  Patient presents with   Follow-up    1 week follow-up on HTN and thyroid Fasting     HPI 1  HTN-Pt is on no meds.  Working on diet/exercise NAS.  Bp's running not checking.  No ha/dizziness/cp/palp/edema/cough/sob 2.  Hypothyroidism-on levo 75.(Was on 65).  Thinks fatigue some better.  3.  Vit D and B12 deficiency-on supps for D but forgot the B12.   4.  Menopause-hot flashes and drenched at night.  Day as well.  FH breast ca-so no HRT.    Health Maintenance Due  Topic Date Due   MAMMOGRAM  Never done    Past Medical History:  Diagnosis Date   Anemia May 2021   Anxiety [id number]    Past Surgical History:  Procedure Laterality Date   TUBAL LIGATION  Dec2003    Outpatient Medications Prior to Visit  Medication Sig Dispense Refill   levothyroxine (SYNTHROID) 75 MCG tablet Take 1 tablet (75 mcg total) by mouth daily. 90 tablet 1   triamcinolone cream (KENALOG) 0.1 % Apply 1 Application topically 2 (two) times daily. 30 g 0   Liraglutide -Weight Management (SAXENDA) 18 MG/3ML SOPN Inject 0.6 mg into the skin daily for 7 days, THEN 1.2 mg daily for 7 days, THEN 1.8 mg daily for 7 days, THEN 2.4 mg daily for 7 days, THEN 3 mg daily for 7 days. (Patient not taking: Reported on 09/15/2022) 10.5 mL 0   No facility-administered medications prior to visit.    No Known Allergies ROS neg/noncontributory except as noted HPI/below      Objective:     BP 122/82   Pulse 88   Temp 98.2 F (36.8 C) (Temporal)   Ht 5' 4.5" (1.638 m)   Wt 211 lb 6 oz (95.9 kg)   SpO2 97%   BMI 35.72 kg/m  Wt Readings from Last 3 Encounters:  09/15/22 211 lb 6 oz (95.9 kg)  08/04/22 211 lb 6.4 oz (95.9 kg)  06/02/22 212 lb (96.2 kg)    Physical Exam   Gen: WDWN NAD HEENT: NCAT, conjunctiva not injected, sclera nonicteric MSK: no gross abnormalities.  NEURO: A&O x3.  CN  II-XII intact.  PSYCH: normal mood. Good eye contact     Assessment & Plan:   Problem List Items Addressed This Visit       Endocrine   Hypothyroidism - Primary   Relevant Orders   TSH   Other Visit Diagnoses     Elevated blood pressure reading       BMI 35.0-35.9,adult       Menopausal and female climacteric states          Elevated bp-resolving.  Cont to work on diet/exercise.   Hypothyroidism-chronic.  Dose just increased.  Will check TSH.  Cont synthroid 75 Menopause-chronic/new.  Hot flashes.  Can't do HRT d/t FH.  Will do gabapentin 100mg -300mg  at HS.  F/u 1 mo Obesity-can't get wegovy.  Since bp ok now, will do phentermine 37.5mg .  SED.  PDMP checked.f/u 1 mo  Meds ordered this encounter  Medications   phentermine 37.5 MG capsule    Sig: Take 1 capsule (37.5 mg total) by mouth every morning.    Dispense:  30 capsule    Refill:  0   gabapentin (NEURONTIN) 100 MG capsule    Sig: Take 1 capsule (100 mg  total) by mouth 3 (three) times daily.    Dispense:  90 capsule    Refill:  3    Angelena Sole, MD

## 2022-09-27 ENCOUNTER — Encounter: Payer: Self-pay | Admitting: Family Medicine

## 2022-10-03 ENCOUNTER — Other Ambulatory Visit: Payer: Self-pay | Admitting: Family Medicine

## 2022-10-03 MED ORDER — ORLISTAT 120 MG PO CAPS: 120.0000 mg | ORAL_CAPSULE | Freq: Three times a day (TID) | ORAL | 2 refills | Status: DC

## 2022-10-13 ENCOUNTER — Encounter: Payer: Self-pay | Admitting: Family Medicine

## 2022-10-13 ENCOUNTER — Ambulatory Visit: Payer: BC Managed Care – PPO | Admitting: Family Medicine

## 2022-10-13 VITALS — BP 120/82 | HR 74 | Temp 97.5°F | Ht 64.5 in | Wt 216.2 lb

## 2022-10-13 DIAGNOSIS — E669 Obesity, unspecified: Secondary | ICD-10-CM

## 2022-10-13 DIAGNOSIS — Z6835 Body mass index (BMI) 35.0-35.9, adult: Secondary | ICD-10-CM | POA: Diagnosis not present

## 2022-10-13 MED ORDER — PHENTERMINE HCL 37.5 MG PO TABS: 37.5000 mg | ORAL_TABLET | Freq: Every day | ORAL | 1 refills | Status: AC

## 2022-10-13 NOTE — Progress Notes (Signed)
   Subjective:     Patient ID: Erika Lang, female    DOB: 09/09/1971, 51 y.o.   MRN: [id number]  Chief Complaint  Patient presents with   Follow-up    4 week follow-up on weight No issues with medication    HPI Obesity-not taking  xenical as cost 60$. When on phentermine too jittery but talked to brother so restarted-does curb appetite. If takes at work, too jittery.  If at home, does fine.  Gained 5#.  Walking more. Clothes are fitting better.   There are no preventive care reminders to display for this patient.   Past Medical History:  Diagnosis Date   Anemia May 2021   Anxiety [id number]    Past Surgical History:  Procedure Laterality Date   TUBAL LIGATION  Dec2003    Outpatient Medications Prior to Visit  Medication Sig Dispense Refill   gabapentin (NEURONTIN) 100 MG capsule Take 1 capsule (100 mg total) by mouth 3 (three) times daily. 90 capsule 3   levothyroxine (SYNTHROID) 75 MCG tablet Take 1 tablet (75 mcg total) by mouth daily. 90 tablet 1   orlistat (XENICAL) 120 MG capsule Take 1 capsule (120 mg total) by mouth 3 (three) times daily with meals. 90 capsule 2   triamcinolone cream (KENALOG) 0.1 % Apply 1 Application topically 2 (two) times daily. 30 g 0   phentermine 37.5 MG capsule Take 1 capsule (37.5 mg total) by mouth every morning. 30 capsule 0   No facility-administered medications prior to visit.    No Known Allergies ROS neg/noncontributory except as noted HPI/below      Objective:     BP 120/82   Pulse 74   Temp (!) 97.5 F (36.4 C) (Temporal)   Ht 5' 4.5" (1.638 m)   Wt 216 lb 4 oz (98.1 kg)   SpO2 98%   BMI 36.55 kg/m  Wt Readings from Last 3 Encounters:  10/13/22 216 lb 4 oz (98.1 kg)  09/15/22 211 lb 6 oz (95.9 kg)  08/04/22 211 lb 6.4 oz (95.9 kg)    Physical Exam   Gen: WDWN NAD HEENT: NCAT, conjunctiva not injected, sclera nonicteric CARDIAC: RRR, S1S2+, no murmur.  EXT:  no edema MSK: no gross abnormalities.  NEURO: A&O  x3.  CN II-XII intact.  PSYCH: normal mood. Good eye contact     Assessment & Plan:   Problem List Items Addressed This Visit   None Visit Diagnoses     BMI 35.0-35.9,adult    -  Primary      Obesity-working on diet/exercise.  Phentermine at work, jittery, at home ok.  Make sure eating breakfast.  Try 1/2 tab for work.  May adjust and be able to take whole tab in 1 wk.  F/u 2 mo.   No orders of the defined types were placed in this encounter.   Angelena Sole, MD

## 2022-10-13 NOTE — Patient Instructions (Signed)
It was very nice to see you today! ° °Merry Christmas ° ° °PLEASE NOTE: ° °If you had any lab tests please let us know if you have not heard back within a few days. You may see your results on MyChart before we have a chance to review them but we will give you a call once they are reviewed by us. If we ordered any referrals today, please let us know if you have not heard from their office within the next week.  ° °Please try these tips to maintain a healthy lifestyle: ° °Eat most of your calories during the day when you are active. Eliminate processed foods including packaged sweets (pies, cakes, cookies), reduce intake of potatoes, white bread, white pasta, and white rice. Look for whole grain options, oat flour or almond flour. ° °Each meal should contain half fruits/vegetables, one quarter protein, and one quarter carbs (no bigger than a computer mouse). ° °Cut down on sweet beverages. This includes juice, soda, and sweet tea. Also watch fruit intake, though this is a healthier sweet option, it still contains natural sugar! Limit to 3 servings daily. ° °Drink at least 1 glass of water with each meal and aim for at least 8 glasses per day ° °Exercise at least 150 minutes every week.   °

## 2022-10-20 ENCOUNTER — Encounter: Payer: Self-pay | Admitting: Family Medicine

## 2022-10-21 ENCOUNTER — Other Ambulatory Visit: Payer: Self-pay | Admitting: Family Medicine

## 2022-10-21 MED ORDER — ZEPBOUND 2.5 MG/0.5ML ~~LOC~~ SOAJ
2.5000 mg | SUBCUTANEOUS | 0 refills | Status: DC
Start: 2022-10-21 — End: 2023-02-16

## 2022-10-25 ENCOUNTER — Telehealth: Payer: BC Managed Care – PPO | Admitting: Family

## 2022-10-25 DIAGNOSIS — R399 Unspecified symptoms and signs involving the genitourinary system: Secondary | ICD-10-CM

## 2022-10-25 MED ORDER — CEPHALEXIN 500 MG PO CAPS: 500.0000 mg | ORAL_CAPSULE | Freq: Two times a day (BID) | ORAL | 0 refills | Status: DC

## 2022-10-25 NOTE — Progress Notes (Signed)

## 2022-10-26 ENCOUNTER — Encounter: Payer: Self-pay | Admitting: Family Medicine

## 2022-10-26 NOTE — Telephone Encounter (Signed)
Already answered in another message that PA is still pending.

## 2022-10-29 ENCOUNTER — Encounter: Payer: Self-pay | Admitting: Family Medicine

## 2022-11-03 ENCOUNTER — Encounter: Payer: Self-pay | Admitting: Family Medicine

## 2022-11-16 ENCOUNTER — Other Ambulatory Visit: Payer: Self-pay | Admitting: Family Medicine

## 2022-11-16 DIAGNOSIS — E039 Hypothyroidism, unspecified: Secondary | ICD-10-CM

## 2022-12-15 ENCOUNTER — Ambulatory Visit: Payer: BC Managed Care – PPO | Admitting: Family Medicine

## 2022-12-28 NOTE — Progress Notes (Deleted)
Office Visit Note  Patient: Erika Lang             Date of Birth: [birth year]           MRN: [id number]             PCP: Tawnya Crook, MD Referring: Bonnita Hollow, MD Visit Date: 01/11/2023 Occupation: @GUAROCC$ @  Subjective:  No chief complaint on file.   History of Present Illness: Erika Lang is a 52 y.o. female ***     Activities of Daily Living:  Patient reports morning stiffness for *** {minute/hour:19697}.   Patient {ACTIONS;DENIES/REPORTS:21021675::"Denies"} nocturnal pain.  Difficulty dressing/grooming: {ACTIONS;DENIES/REPORTS:21021675::"Denies"} Difficulty climbing stairs: {ACTIONS;DENIES/REPORTS:21021675::"Denies"} Difficulty getting out of chair: {ACTIONS;DENIES/REPORTS:21021675::"Denies"} Difficulty using hands for taps, buttons, cutlery, and/or writing: {ACTIONS;DENIES/REPORTS:21021675::"Denies"}  No Rheumatology ROS completed.   PMFS History:  Patient Active Problem List   Diagnosis Date Noted   Hypothyroidism 05/07/2022   Chronic fatigue 05/07/2022   Paresthesia 05/07/2022   Vitamin D deficiency 06/24/2020    Past Medical History:  Diagnosis Date   Anemia May 2021   Anxiety [id number]    Family History  Problem Relation Age of Onset   Diabetes Mother    Breast cancer Mother    Multiple myeloma Father    Healthy Brother    Thyroid disease Paternal Grandmother    Healthy Daughter    Healthy Son    Early death Maternal Grandmother    Past Surgical History:  Procedure Laterality Date   TUBAL LIGATION  [phone]   Social History   Social History Narrative   [employer] researcher, broadcasting/film/video History  Administered Date(s) Administered   [employer] Vaccination 10/07/2020     Objective: Vital Signs: There were no vitals taken for this visit.   Physical Exam   Musculoskeletal Exam: ***  CDAI Exam: CDAI Score: -- Patient Global: --; Provider Global: -- Swollen: --; Tender: -- Joint Exam 01/11/2023   No joint  exam has been documented for this visit   There is currently no information documented on the homunculus. Go to the Rheumatology activity and complete the homunculus joint exam.  Investigation: No additional findings.  Imaging: No results found.  Recent Labs: Lab Results  Component Value Date   WBC 7.7 05/07/2022   HGB 12.3 05/07/2022   PLT 327.0 05/07/2022   NA 137 05/07/2022   K 4.0 05/07/2022   CL 104 05/07/2022   CO2 26 05/07/2022   GLUCOSE 104 (H) 05/07/2022   BUN 14 05/07/2022   CREATININE 0.78 05/07/2022   BILITOT 1.0 05/07/2022   ALKPHOS 69 05/07/2022   AST 21 05/07/2022   ALT 23 05/07/2022   PROT 7.7 05/07/2022   ALBUMIN 4.3 05/07/2022   CALCIUM 9.1 05/07/2022   GFRAA 113 06/10/2020    Speciality Comments: No specialty comments available.  Procedures:  No procedures performed Allergies: Patient has no known allergies.   Assessment / Plan:     Visit Diagnoses: Positive ANA (antinuclear antibody) - 04/01/22: ANA 1:80NS, TSH 6.83, T4 WNL,  Chronic fatigue  Paresthesia  Vitamin D deficiency  Other specified hypothyroidism  Orders: No orders of the defined types were placed in this encounter.  No orders of the defined types were placed in this encounter.   Face-to-face time spent with patient was *** minutes. Greater than 50% of time was spent in counseling and coordination of care.  Follow-Up Instructions: No follow-ups on file.   Ofilia Neas, PA-C  Note - This record has been created using  Editor, commissioning.  Chart creation errors have been sought, but may not always  have been located. Such creation errors do not reflect on  the standard of medical care.

## 2023-01-11 ENCOUNTER — Encounter: Payer: BC Managed Care – PPO | Admitting: Rheumatology

## 2023-01-11 DIAGNOSIS — R5382 Chronic fatigue, unspecified: Secondary | ICD-10-CM

## 2023-01-11 DIAGNOSIS — E038 Other specified hypothyroidism: Secondary | ICD-10-CM

## 2023-01-11 DIAGNOSIS — R202 Paresthesia of skin: Secondary | ICD-10-CM

## 2023-01-11 DIAGNOSIS — E559 Vitamin D deficiency, unspecified: Secondary | ICD-10-CM

## 2023-01-11 DIAGNOSIS — R768 Other specified abnormal immunological findings in serum: Secondary | ICD-10-CM

## 2023-01-19 ENCOUNTER — Telehealth: Payer: BC Managed Care – PPO | Admitting: Physician Assistant

## 2023-01-19 DIAGNOSIS — J069 Acute upper respiratory infection, unspecified: Secondary | ICD-10-CM

## 2023-01-20 MED ORDER — PROMETHAZINE-DM 6.25-15 MG/5ML PO SYRP: 5.0000 mL | ORAL_SOLUTION | Freq: Four times a day (QID) | ORAL | 0 refills | Status: AC | PRN

## 2023-01-20 MED ORDER — FLUTICASONE PROPIONATE 50 MCG/ACT NA SUSP: 2.0000 | Freq: Every day | NASAL | 0 refills | Status: DC

## 2023-01-20 NOTE — Progress Notes (Signed)
E-Visit for Upper Respiratory Infection   We are sorry you are not feeling well.  Here is how we plan to help!  Based on what you have shared with me, it looks like you may have a viral upper respiratory infection.  Upper respiratory infections are caused by a large number of viruses; however, rhinovirus is the most common cause.   Symptoms vary from person to person, with common symptoms including sore throat, cough, fatigue or lack of energy and feeling of general discomfort.  A low-grade fever of up to 100.4 may present, but is often uncommon.  Symptoms vary however, and are closely related to a person's age or underlying illnesses.  The most common symptoms associated with an upper respiratory infection are nasal discharge or congestion, cough, sneezing, headache and pressure in the ears and face.  These symptoms usually persist for about 3 to 10 days, but can last up to 2 weeks.  It is important to know that upper respiratory infections do not cause serious illness or complications in most cases.    Upper respiratory infections can be transmitted from person to person, with the most common method of transmission being a person's hands.  The virus is able to live on the skin and can infect other persons for up to 2 hours after direct contact.  Also, these can be transmitted when someone coughs or sneezes; thus, it is important to cover the mouth to reduce this risk.  To keep the spread of the illness at bay, good hand hygiene is very important.  This is an infection that is most likely caused by a virus. There are no specific treatments other than to help you with the symptoms until the infection runs its course.  We are sorry you are not feeling well.  Here is how we plan to help!   For nasal congestion, you may use an oral decongestants such as Mucinex D or if you have glaucoma or high blood pressure use plain Mucinex.  Saline nasal spray or nasal drops can help and can safely be used as often as  needed for congestion.  For your congestion, I have prescribed Fluticasone nasal spray one spray in each nostril twice a day  If you do not have a history of heart disease, hypertension, diabetes or thyroid disease, prostate/bladder issues or glaucoma, you may also use Sudafed to treat nasal congestion.  It is highly recommended that you consult with a pharmacist or your primary care physician to ensure this medication is safe for you to take.     If you have a cough, you may use cough suppressants such as Delsym and Robitussin.  If you have glaucoma or high blood pressure, you can also use Coricidin HBP.   For cough I have prescribed for you Promethazine DM cough syrup Take 5mL every 6 hours as needed for cough  If you have a sore or scratchy throat, use a saltwater gargle-  to  teaspoon of salt dissolved in a 4-ounce to 8-ounce glass of warm water.  Gargle the solution for approximately 15-30 seconds and then spit.  It is important not to swallow the solution.  You can also use throat lozenges/cough drops and Chloraseptic spray to help with throat pain or discomfort.  Warm or cold liquids can also be helpful in relieving throat pain.  For headache, pain or general discomfort, you can use Ibuprofen or Tylenol as directed.   Some authorities believe that zinc sprays or the use of Echinacea   may shorten the course of your symptoms.   HOME CARE Only take medications as instructed by your medical team. Be sure to drink plenty of fluids. Water is fine as well as fruit juices, sodas and electrolyte beverages. You may want to stay away from caffeine or alcohol. If you are nauseated, try taking small sips of liquids. How do you know if you are getting enough fluid? Your urine should be a pale yellow or almost colorless. Get rest. Taking a steamy shower or using a humidifier may help nasal congestion and ease sore throat pain. You can place a towel over your head and breathe in the steam from hot water  coming from a faucet. Using a saline nasal spray works much the same way. Cough drops, hard candies and sore throat lozenges may ease your cough. Avoid close contacts especially the very young and the elderly Cover your mouth if you cough or sneeze Always remember to wash your hands.   GET HELP RIGHT AWAY IF: You develop worsening fever. If your symptoms do not improve within 10 days You develop yellow or green discharge from your nose over 3 days. You have coughing fits You develop a severe head ache or visual changes. You develop shortness of breath, difficulty breathing or start having chest pain Your symptoms persist after you have completed your treatment plan  MAKE SURE YOU  Understand these instructions. Will watch your condition. Will get help right away if you are not doing well or get worse.  Thank you for choosing an e-visit.  Your e-visit answers were reviewed by a board certified advanced clinical practitioner to complete your personal care plan. Depending upon the condition, your plan could have included both over the counter or prescription medications.  Please review your pharmacy choice. Make sure the pharmacy is open so you can pick up prescription now. If there is a problem, you may contact your provider through MyChart messaging and have the prescription routed to another pharmacy.  Your safety is important to us. If you have drug allergies check your prescription carefully.   For the next 24 hours you can use MyChart to ask questions about today's visit, request a non-urgent call back, or ask for a work or school excuse. You will get an email in the next two days asking about your experience. I hope that your e-visit has been valuable and will speed your recovery.  I have spent 5 minutes in review of e-visit questionnaire, review and updating patient chart, medical decision making and response to patient.   Roland Prine M Holden Maniscalco, PA-C  

## 2023-02-04 ENCOUNTER — Ambulatory Visit: Payer: BC Managed Care – PPO | Admitting: Rheumatology

## 2023-02-10 ENCOUNTER — Ambulatory Visit: Payer: BC Managed Care – PPO | Admitting: Rheumatology

## 2023-02-14 ENCOUNTER — Telehealth: Payer: BC Managed Care – PPO | Admitting: Family

## 2023-02-14 DIAGNOSIS — J069 Acute upper respiratory infection, unspecified: Secondary | ICD-10-CM | POA: Diagnosis not present

## 2023-02-14 MED ORDER — CETIRIZINE HCL 10 MG PO TABS: 10.0000 mg | ORAL_TABLET | Freq: Every day | ORAL | 1 refills | Status: DC

## 2023-02-14 MED ORDER — FLUTICASONE PROPIONATE 50 MCG/ACT NA SUSP: 2.0000 | Freq: Every day | NASAL | 6 refills | Status: DC

## 2023-02-14 NOTE — Progress Notes (Signed)
E-Visit for Upper Respiratory Infection  ° °We are sorry you are not feeling well.  Here is how we plan to help! ° °Based on what you have shared with me, it looks like you may have a viral upper respiratory infection.  Upper respiratory infections are caused by a large number of viruses; however, rhinovirus is the most common cause.  ° °Symptoms vary from person to person, with common symptoms including sore throat, cough, fatigue or lack of energy and feeling of general discomfort.  A low-grade fever of up to 100.4 may present, but is often uncommon.  Symptoms vary however, and are closely related to a person's age or underlying illnesses.  The most common symptoms associated with an upper respiratory infection are nasal discharge or congestion, cough, sneezing, headache and pressure in the ears and face.  These symptoms usually persist for about 3 to 10 days, but can last up to 2 weeks.  It is important to know that upper respiratory infections do not cause serious illness or complications in most cases.   ° °Upper respiratory infections can be transmitted from person to person, with the most common method of transmission being a person's hands.  The virus is able to live on the skin and can infect other persons for up to 2 hours after direct contact.  Also, these can be transmitted when someone coughs or sneezes; thus, it is important to cover the mouth to reduce this risk.  To keep the spread of the illness at bay, good hand hygiene is very important. ° °This is an infection that is most likely caused by a virus. There are no specific treatments other than to help you with the symptoms until the infection runs its course.  We are sorry you are not feeling well.  Here is how we plan to help! ° ° °For nasal congestion, you may use an oral decongestants such as Mucinex D or if you have glaucoma or high blood pressure use plain Mucinex.  Saline nasal spray or nasal drops can help and can safely be used as often as  needed for congestion.  For your congestion, I have prescribed Fluticasone nasal spray one spray in each nostril twice a day and zyrtec 10 mg daily.  ° °If you do not have a history of heart disease, hypertension, diabetes or thyroid disease, prostate/bladder issues or glaucoma, you may also use Sudafed to treat nasal congestion.  It is highly recommended that you consult with a pharmacist or your primary care physician to ensure this medication is safe for you to take.    ° °If you have a cough, you may use cough suppressants such as Delsym and Robitussin.  If you have glaucoma or high blood pressure, you can also use Coricidin HBP.   °For cough I have prescribed for you A prescription cough medication called Tessalon Perles 100 mg. You may take 1-2 capsules every 8 hours as needed for cough ° °If you have a sore or scratchy throat, use a saltwater gargle- ¼ to ½ teaspoon of salt dissolved in a 4-ounce to 8-ounce glass of warm water.  Gargle the solution for approximately 15-30 seconds and then spit.  It is important not to swallow the solution.  You can also use throat lozenges/cough drops and Chloraseptic spray to help with throat pain or discomfort.  Warm or cold liquids can also be helpful in relieving throat pain. ° °For headache, pain or general discomfort, you can use Ibuprofen or Tylenol as   directed.   °Some authorities believe that zinc sprays or the use of Echinacea may shorten the course of your symptoms. ° ° °HOME CARE °Only take medications as instructed by your medical team. °Be sure to drink plenty of fluids. Water is fine as well as fruit juices, sodas and electrolyte beverages. You may want to stay away from caffeine or alcohol. If you are nauseated, try taking small sips of liquids. How do you know if you are getting enough fluid? Your urine should be a pale yellow or almost colorless. °Get rest. °Taking a steamy shower or using a humidifier may help nasal congestion and ease sore throat pain. You  can place a towel over your head and breathe in the steam from hot water coming from a faucet. °Using a saline nasal spray works much the same way. °Cough drops, hard candies and sore throat lozenges may ease your cough. °Avoid close contacts especially the very young and the elderly °Cover your mouth if you cough or sneeze °Always remember to wash your hands.  ° °GET HELP RIGHT AWAY IF: °You develop worsening fever. °If your symptoms do not improve within 10 days °You develop yellow or green discharge from your nose over 3 days. °You have coughing fits °You develop a severe head ache or visual changes. °You develop shortness of breath, difficulty breathing or start having chest pain °Your symptoms persist after you have completed your treatment plan ° °MAKE SURE YOU  °Understand these instructions. °Will watch your condition. °Will get help right away if you are not doing well or get worse. ° °Thank you for choosing an e-visit. ° °Your e-visit answers were reviewed by a board certified advanced clinical practitioner to complete your personal care plan. Depending upon the condition, your plan could have included both over the counter or prescription medications. ° °Please review your pharmacy choice. Make sure the pharmacy is open so you can pick up prescription now. If there is a problem, you may contact your provider through MyChart messaging and have the prescription routed to another pharmacy.  Your safety is important to us. If you have drug allergies check your prescription carefully.  ° °For the next 24 hours you can use MyChart to ask questions about today's visit, request a non-urgent call back, or ask for a work or school excuse. °You will get an email in the next two days asking about your experience. I hope that your e-visit has been valuable and will speed your recovery. ° °Approximately 5 minutes was spent documenting and reviewing patient's chart.  ° ° ° °

## 2023-02-15 ENCOUNTER — Encounter: Payer: Self-pay | Admitting: Family Medicine

## 2023-02-16 ENCOUNTER — Ambulatory Visit: Payer: BC Managed Care – PPO | Admitting: Family Medicine

## 2023-02-16 VITALS — BP 119/80 | HR 103 | Temp 99.1°F | Ht 64.5 in | Wt 210.0 lb

## 2023-02-16 DIAGNOSIS — R059 Cough, unspecified: Secondary | ICD-10-CM

## 2023-02-16 DIAGNOSIS — J40 Bronchitis, not specified as acute or chronic: Secondary | ICD-10-CM | POA: Diagnosis not present

## 2023-02-16 DIAGNOSIS — J029 Acute pharyngitis, unspecified: Secondary | ICD-10-CM

## 2023-02-16 LAB — POC COVID19 BINAXNOW: SARS Coronavirus 2 Ag: NEGATIVE

## 2023-02-16 LAB — POCT RAPID STREP A (OFFICE): Rapid Strep A Screen: NEGATIVE

## 2023-02-16 MED ORDER — AZITHROMYCIN 250 MG PO TABS: ORAL_TABLET | ORAL | 0 refills | Status: AC

## 2023-02-16 MED ORDER — PREDNISONE 20 MG PO TABS: 40.0000 mg | ORAL_TABLET | Freq: Every day | ORAL | 0 refills | Status: AC

## 2023-02-16 NOTE — Progress Notes (Signed)
Subjective:     Patient ID: Erika Lang, female    DOB: 04/12/1971, 52 y.o.   MRN: [id number]  Chief Complaint  Patient presents with   Cough    Sx started Friday, productive cough with yellow mucus    Sore Throat    Throat feel like it is burning   Laryngitis    Started Sunday    HPI Laryngitis for 2 days.  Sore throat.  Cough for 4 days-productive yellow.  E visit 3/12 and 4/7-viral upper respiratory infection (URI)/allergies. From 3/12 resolved.  Then on 4/5-chills, tactile temps and coughing.  Cough keeping awake.   Taking cough syrup.  Taking Nyquil.  COVID negative 2 weeks ago.  There are no preventive care reminders to display for this patient.  Past Medical History:  Diagnosis Date   Anemia May 2021   Anxiety [id number]    Past Surgical History:  Procedure Laterality Date   TUBAL LIGATION  Dec2003    Outpatient Medications Prior to Visit  Medication Sig Dispense Refill   levothyroxine (SYNTHROID) 75 MCG tablet TAKE 1 TABLET(75 MCG) BY MOUTH DAILY 90 tablet 1   phentermine (ADIPEX-P) 37.5 MG tablet Take 1 tablet (37.5 mg total) by mouth daily before breakfast. 30 tablet 1   promethazine-dextromethorphan (PROMETHAZINE-DM) 6.25-15 MG/5ML syrup Take 5 mLs by mouth 4 (four) times daily as needed. 118 mL 0   triamcinolone cream (KENALOG) 0.1 % Apply 1 Application topically 2 (two) times daily. 30 g 0   cetirizine (ZYRTEC ALLERGY) 10 MG tablet Take 1 tablet (10 mg total) by mouth daily. (Patient not taking: Reported on 02/16/2023) 90 tablet 1   fluticasone (FLONASE) 50 MCG/ACT nasal spray Place 2 sprays into both nostrils daily. 16 g 0   fluticasone (FLONASE) 50 MCG/ACT nasal spray Place 2 sprays into both nostrils daily. (Patient not taking: Reported on 02/16/2023) 16 g 6   gabapentin (NEURONTIN) 100 MG capsule Take 1 capsule (100 mg total) by mouth 3 (three) times daily. (Patient not taking: Reported on 02/16/2023) 90 capsule 3   orlistat (XENICAL) 120 MG capsule Take 1  capsule (120 mg total) by mouth 3 (three) times daily with meals. (Patient not taking: Reported on 02/16/2023) 90 capsule 2   Tirzepatide-Weight Management (ZEPBOUND) 2.5 MG/0.5ML SOAJ Inject 2.5 mg into the skin once a week. (Patient not taking: Reported on 02/16/2023) 2 mL 0   No facility-administered medications prior to visit.    No Known Allergies ROS neg/noncontributory except as noted HPI/below      Objective:     BP 119/80   Pulse (!) 103   Temp 99.1 F (37.3 C) (Temporal)   Ht 5' 4.5" (1.638 m)   Wt 210 lb (95.3 kg)   SpO2 99%   BMI 35.49 kg/m  Wt Readings from Last 3 Encounters:  02/16/23 210 lb (95.3 kg)  10/13/22 216 lb 4 oz (98.1 kg)  09/15/22 211 lb 6 oz (95.9 kg)    Physical Exam   Gen: WDWN NAD HEENT: NCAT, conjunctiva not injected, sclera nonicteric TM WNL B, OP moist, no exudates .   Hoarse, congested.  Some canker sores on tongue and roof NECK:  supple, no thyromegaly, no nodes, no carotid bruits CARDIAC: RRR, S1S2+, no murmur. DP 2+B LUNGS: CTAB. No wheezes EXT:  no edema MSK: no gross abnormalities.  NEURO: A&O x3.  CN II-XII intact.  PSYCH: normal mood. Good eye contact  Results for orders placed or performed in visit on 02/16/23  POCT rapid  strep A  Result Value Ref Range   Rapid Strep A Screen Negative Negative  POC COVID-19  Result Value Ref Range   SARS Coronavirus 2 Ag Negative Negative        Assessment & Plan:   Problem List Items Addressed This Visit   None Visit Diagnoses     Bronchitis    -  Primary   Cough, unspecified type       Relevant Orders   POCT rapid strep A   POC COVID-19   Sore throat       Relevant Orders   POCT rapid strep A   POC COVID-19      Bronchitis-zpk, prednisone 40 mg daily.   Canker sores-L lysine  No orders of the defined types were placed in this encounter.   Angelena Sole, MD

## 2023-02-16 NOTE — Patient Instructions (Addendum)
L lysine daily for canker sores  Zpack  Prednisone-can stop early if want

## 2023-02-18 ENCOUNTER — Encounter: Payer: Self-pay | Admitting: Family Medicine

## 2023-02-18 ENCOUNTER — Other Ambulatory Visit: Payer: Self-pay | Admitting: *Deleted

## 2023-02-18 DIAGNOSIS — K219 Gastro-esophageal reflux disease without esophagitis: Secondary | ICD-10-CM

## 2023-02-18 MED ORDER — OMEPRAZOLE 40 MG PO CPDR: 40.0000 mg | DELAYED_RELEASE_CAPSULE | Freq: Two times a day (BID) | ORAL | 1 refills | Status: AC

## 2023-02-24 ENCOUNTER — Encounter: Payer: Self-pay | Admitting: Family Medicine

## 2023-03-04 ENCOUNTER — Telehealth: Payer: BC Managed Care – PPO | Admitting: Family Medicine

## 2023-03-04 DIAGNOSIS — B353 Tinea pedis: Secondary | ICD-10-CM | POA: Diagnosis not present

## 2023-03-04 MED ORDER — TERBINAFINE HCL 250 MG PO TABS
250.0000 mg | ORAL_TABLET | Freq: Every day | ORAL | 0 refills | Status: AC
Start: 2023-03-04 — End: 2023-03-11

## 2023-03-04 NOTE — Progress Notes (Signed)
E-Visit for Athlete's Foot  We are sorry that you are not feeling well. Here is how we plan to help!  Based on what you shared with me it looks like you have tinea pedis, or "Athlete's Foot".  This type of rash can spread through shared towels, clothing, bedding, etc., as well as hard surfaces (particularly in moist areas) such as shower stalls, locker room floors, pool areas, etc. The symptoms of Athlete's Foot include red, swollen, peeling, itchy skin between the toes (especially between the pinky toe and the one next to it). The sole and heel of the foot may also be affected. In severe cases, the skin on the feet can blister.  Athlete's foot can usually be treated with over-the-counter topical antifungal products; but sometimes with chronic or extensive tinea pedis, prescription oral medications are needed.   I am recommending:Clotrimazole 1% cream or gel, apply to area twice per day   Prescription medications are only indicated for an extensive rash or if over the counter treatments have failed.  I am prescribing:Terbinafine 250 mg once per day for one week  HOME CARE:  Keep feet clean, dry, and cool. Avoid using swimming pools, public showers, or foot baths. Wear sandals when possible or air shoes out by alternating them every 2-3 days. Avoid wearing closed shoes and wearing socks made from fabric that doesn't dry easily (for example, nylon). Treat the infection with recommended medication  GET HELP RIGHT AWAY IF:  Symptoms that don't go away after treatment. Severe itching that persists. If your rash spreads or swells. If your rash begins to have drainage or smell. You develop a fever.  MAKE SURE YOU   Understand these instructions. Will watch your condition. Will get help right away if you are not doing well or get worse.   Thank you for choosing an e-visit.  Your e-visit answers were reviewed by a board certified advanced clinical practitioner to complete your  personal care plan. Depending upon the condition, your plan could have included both over the counter or prescription medications.  Please review your pharmacy choice. Make sure the pharmacy is open so you can pick up prescription now. If there is a problem, you may contact your provider through [employer] and have the prescription routed to another pharmacy.  Your safety is important to [place]. If you have drug allergies check your prescription carefully.   For the next 24 hours you can use MyChart to ask questions about today's visit, request a non-urgent call back, or ask for a work or school excuse.  You will get an email in the next two days asking about your experience. I hope that your e-visit has been valuable and will speed your recovery  References or for more information:  FatMen[employer].com.au?search=[email or web address]&source=search_result&selectedTitle=1~104&usage_type=default&display_rank=1  [email or web address]        I provided 5 minutes of non face-to-face time during this encounter for chart review, medication and order placement, as well as and documentation.

## 2023-03-10 ENCOUNTER — Encounter: Payer: Self-pay | Admitting: Family Medicine

## 2023-03-10 ENCOUNTER — Other Ambulatory Visit: Payer: Self-pay

## 2023-03-10 DIAGNOSIS — E039 Hypothyroidism, unspecified: Secondary | ICD-10-CM

## 2023-03-10 MED ORDER — LEVOTHYROXINE SODIUM 75 MCG PO TABS
ORAL_TABLET | ORAL | 1 refills | Status: DC
Start: 2023-03-10 — End: 2023-08-30

## 2023-04-05 ENCOUNTER — Telehealth: Payer: Self-pay

## 2023-04-05 ENCOUNTER — Other Ambulatory Visit (HOSPITAL_COMMUNITY): Payer: Self-pay

## 2023-04-05 NOTE — Telephone Encounter (Signed)
Received NOTIFICATION from [facility] regarding Prior Authorization for Omeprazole 40MG  dr capsules.  Key: [id number]  Authorization has been DENIED due to This health benefit plan does not cover the following services, supplies, drugs or charges: Any drug that is therapeutically equivalent to an over-the-counter drug where the over-the-counter products contain the same active ingredients as the prescription product at the same, or similar, strengths. -OR- Drugs that are Purchased over-the-counter,.Marland Kitchen

## 2023-04-06 ENCOUNTER — Encounter: Payer: Self-pay | Admitting: *Deleted

## 2023-04-06 NOTE — Telephone Encounter (Signed)
Patient notified

## 2023-04-10 ENCOUNTER — Encounter: Payer: Self-pay | Admitting: Family Medicine

## 2023-04-23 ENCOUNTER — Encounter: Payer: Self-pay | Admitting: Family Medicine

## 2023-04-27 ENCOUNTER — Telehealth: Payer: BC Managed Care – PPO | Admitting: Physician Assistant

## 2023-04-27 ENCOUNTER — Ambulatory Visit: Payer: BC Managed Care – PPO | Admitting: Family Medicine

## 2023-04-27 DIAGNOSIS — R3989 Other symptoms and signs involving the genitourinary system: Secondary | ICD-10-CM

## 2023-04-27 MED ORDER — CEPHALEXIN 500 MG PO CAPS: 500.0000 mg | ORAL_CAPSULE | Freq: Two times a day (BID) | ORAL | 0 refills | Status: AC

## 2023-04-27 NOTE — Progress Notes (Signed)

## 2023-04-27 NOTE — Progress Notes (Signed)
I have spent 5 minutes in review of e-visit questionnaire, review and updating patient chart, medical decision making and response to patient.   Braley Luckenbaugh Cody Melat Wrisley, PA-C    

## 2023-05-18 ENCOUNTER — Ambulatory Visit: Payer: BC Managed Care – PPO | Admitting: Family Medicine

## 2023-06-20 ENCOUNTER — Encounter: Payer: Self-pay | Admitting: Family Medicine

## 2023-07-27 ENCOUNTER — Telehealth: Payer: BC Managed Care – PPO | Admitting: Physician Assistant

## 2023-07-27 DIAGNOSIS — R3989 Other symptoms and signs involving the genitourinary system: Secondary | ICD-10-CM

## 2023-07-27 MED ORDER — CEPHALEXIN 500 MG PO CAPS: 500.0000 mg | ORAL_CAPSULE | Freq: Two times a day (BID) | ORAL | 0 refills | Status: AC

## 2023-07-27 NOTE — Progress Notes (Signed)
I have spent 5 minutes in review of e-visit questionnaire, review and updating patient chart, medical decision making and response to patient.   Mia Milan Cody Jacklynn Dehaas, PA-C    

## 2023-07-27 NOTE — Progress Notes (Signed)

## 2023-08-29 ENCOUNTER — Encounter: Payer: Self-pay | Admitting: Family Medicine

## 2023-08-30 ENCOUNTER — Other Ambulatory Visit: Payer: Self-pay | Admitting: *Deleted

## 2023-08-30 DIAGNOSIS — E039 Hypothyroidism, unspecified: Secondary | ICD-10-CM

## 2023-08-30 MED ORDER — LEVOTHYROXINE SODIUM 75 MCG PO TABS
ORAL_TABLET | ORAL | 1 refills | Status: DC
Start: 2023-08-30 — End: 2023-10-03

## 2023-09-06 ENCOUNTER — Telehealth: Payer: BC Managed Care – PPO | Admitting: Physician Assistant

## 2023-09-06 DIAGNOSIS — B353 Tinea pedis: Secondary | ICD-10-CM

## 2023-09-06 MED ORDER — CLOTRIMAZOLE 1 % EX CREA
1.0000 | TOPICAL_CREAM | Freq: Two times a day (BID) | CUTANEOUS | 0 refills | Status: AC
Start: 2023-09-06 — End: ?

## 2023-09-06 MED ORDER — TERBINAFINE HCL 250 MG PO TABS
250.0000 mg | ORAL_TABLET | Freq: Every day | ORAL | 0 refills | Status: AC
Start: 2023-09-06 — End: ?

## 2023-09-06 NOTE — Progress Notes (Signed)
E-Visit for Athlete's Foot  We are sorry that you are not feeling well. Here is how we plan to help!  Based on what you shared with me it looks like you have tinea pedis, or "Athlete's Foot".  This type of rash can spread through shared towels, clothing, bedding, etc., as well as hard surfaces (particularly in moist areas) such as shower stalls, locker room floors, pool areas, etc. The symptoms of Athlete's Foot include red, swollen, peeling, itchy skin between the toes (especially between the pinky toe and the one next to it). The sole and heel of the foot may also be affected. In severe cases, the skin on the feet can blister.  Athlete's foot can usually be treated with over-the-counter topical antifungal products; but sometimes with chronic or extensive tinea pedis, prescription oral medications are needed.   I am recommending:Clotrimazole 1% cream or gel, apply to area twice per day   Prescription medications are only indicated for an extensive rash or if over the counter treatments have failed.  I am prescribing:Terbinafine 250 mg once per day for one week  HOME CARE:  Keep feet clean, dry, and cool. Avoid using swimming pools, public showers, or foot baths. Wear sandals when possible or air shoes out by alternating them every 2-3 days. Avoid wearing closed shoes and wearing socks made from fabric that doesn't dry easily (for example, nylon). Treat the infection with recommended medication  GET HELP RIGHT AWAY IF:  Symptoms that don't go away after treatment. Severe itching that persists. If your rash spreads or swells. If your rash begins to have drainage or smell. You develop a fever.  MAKE SURE YOU   Understand these instructions. Will watch your condition. Will get help right away if you are not doing well or get worse.   Thank you for choosing an e-visit.  Your e-visit answers were reviewed by a board certified advanced clinical practitioner to complete your  personal care plan. Depending upon the condition, your plan could have included both over the counter or prescription medications.  Please review your pharmacy choice. Make sure the pharmacy is open so you can pick up prescription now. If there is a problem, you may contact your provider through [employer] and have the prescription routed to another pharmacy.  Your safety is important to [place]. If you have drug allergies check your prescription carefully.   For the next 24 hours you can use MyChart to ask questions about today's visit, request a non-urgent call back, or ask for a work or school excuse.  You will get an email in the next two days asking about your experience. I hope that your e-visit has been valuable and will speed your recovery  References or for more information:  FatMenus.com.au?search=athletes%71foot%20treatment&source=search_result&selectedTitle=1~104&usage_type=default&display_rank=1  [email or web address]   I have spent 5 minutes in review of e-visit questionnaire, review and updating patient chart, medical decision making and response to patient.   Margaretann Loveless, PA-C

## 2023-10-02 ENCOUNTER — Telehealth: Payer: BC Managed Care – PPO | Admitting: Nurse Practitioner

## 2023-10-02 DIAGNOSIS — H00014 Hordeolum externum left upper eyelid: Secondary | ICD-10-CM | POA: Diagnosis not present

## 2023-10-02 MED ORDER — NEOMYCIN-POLYMYXIN-HC 3.5-10000-1 OP SUSP
3.0000 [drp] | Freq: Four times a day (QID) | OPHTHALMIC | 0 refills | Status: AC
Start: 2023-10-02 — End: ?

## 2023-10-02 NOTE — Progress Notes (Signed)
I have spent 5 minutes in review of e-visit questionnaire, review and updating patient chart, medical decision making and response to patient.  ° °Jerrell Mangel W Secilia Apps, NP ° °  °

## 2023-10-02 NOTE — Progress Notes (Signed)
  E-Visit for Stye   We are sorry that you are not feeling well. Here is how we plan to help!  Based on what you have shared with me it looks like you have a stye.  A stye is an inflammation of the eyelid.  It is often a red, painful lump near the edge of the eyelid that may look like a boil or a pimple.  A stye develops when an infection occurs at the base of an eyelash.   We have made appropriate suggestions for you based upon your presentation: Your symptoms may indicate an infection of the sclera.  The use of anti-inflammatory and antibiotic eye drops for a week will help resolve this condition.  I have sent in neomycin-polymyxin HC opthalmic suspension, two to three drops in the affected eye every 4 hours.  If your symptoms do not improve over the next two to three days you should be seen in your doctor's office.  HOME CARE:  Wash your hands often! Let the stye open on its own. Don't squeeze or open it. Don't rub your eyes. This can irritate your eyes and let in bacteria.  If you need to touch your eyes, wash your hands first. Don't wear eye makeup or contact lenses until the area has healed.  GET HELP RIGHT AWAY IF:  Your symptoms do not improve. You develop blurred or loss of vision. Your symptoms worsen (increased discharge, pain or redness).   Thank you for choosing an e-visit.  Your e-visit answers were reviewed by a board certified advanced clinical practitioner to complete your personal care plan. Depending upon the condition, your plan could have included both over the counter or prescription medications.  Please review your pharmacy choice. Make sure the pharmacy is open so you can pick up prescription now. If there is a problem, you may contact your provider through [employer] and have the prescription routed to another pharmacy.  Your safety is important to [place]. If you have drug allergies check your prescription carefully.   For the next 24 hours you can use  MyChart to ask questions about today's visit, request a non-urgent call back, or ask for a work or school excuse. You will get an email in the next two days asking about your experience. I hope that your e-visit has been valuable and will speed your recovery.

## 2023-10-03 ENCOUNTER — Other Ambulatory Visit: Payer: Self-pay | Admitting: Family Medicine

## 2023-10-03 DIAGNOSIS — E039 Hypothyroidism, unspecified: Secondary | ICD-10-CM

## 2023-10-03 NOTE — Telephone Encounter (Signed)
 Due appt

## 2023-10-04 NOTE — Telephone Encounter (Signed)
LVM informing pt of message below. Informed to call back for scheduling.

## 2023-10-31 ENCOUNTER — Other Ambulatory Visit: Payer: Self-pay | Admitting: Family Medicine

## 2023-10-31 DIAGNOSIS — E039 Hypothyroidism, unspecified: Secondary | ICD-10-CM

## 2023-11-01 ENCOUNTER — Other Ambulatory Visit: Payer: Self-pay | Admitting: Family Medicine

## 2023-11-01 DIAGNOSIS — E039 Hypothyroidism, unspecified: Secondary | ICD-10-CM

## 2023-11-01 MED ORDER — LEVOTHYROXINE SODIUM 75 MCG PO TABS
ORAL_TABLET | ORAL | 1 refills | Status: DC
Start: 2023-11-01 — End: 2024-06-16

## 2023-11-10 ENCOUNTER — Other Ambulatory Visit: Payer: Self-pay | Admitting: Nurse Practitioner

## 2023-11-10 DIAGNOSIS — H00014 Hordeolum externum left upper eyelid: Secondary | ICD-10-CM

## 2023-11-11 NOTE — Telephone Encounter (Signed)
 Needs appt

## 2023-11-28 ENCOUNTER — Other Ambulatory Visit: Payer: Self-pay

## 2023-11-28 ENCOUNTER — Encounter: Payer: Self-pay | Admitting: Family Medicine

## 2023-11-28 ENCOUNTER — Emergency Department (HOSPITAL_COMMUNITY)
Admission: EM | Admit: 2023-11-28 | Discharge: 2023-11-29 | Disposition: A | Discharge status: Home / Self Care | Payer: Self-pay | Attending: Emergency Medicine | Admitting: Emergency Medicine

## 2023-11-28 DIAGNOSIS — E860 Dehydration: Secondary | ICD-10-CM | POA: Insufficient documentation

## 2023-11-28 DIAGNOSIS — R42 Dizziness and giddiness: Secondary | ICD-10-CM

## 2023-11-28 DIAGNOSIS — R000 Tachycardia, unspecified: Secondary | ICD-10-CM | POA: Insufficient documentation

## 2023-11-28 LAB — BASIC METABOLIC PANEL
Anion gap: 10 (ref 5–15)
BUN: 12 mg/dL (ref 6–20)
CO2: 21 mmol/L — ABNORMAL LOW (ref 22–32)
Calcium: 8.9 mg/dL (ref 8.9–10.3)
Chloride: 107 mmol/L (ref 98–111)
Creatinine, Ser: 0.77 mg/dL (ref 0.44–1.00)
GFR, Estimated: >60 mL/min (ref >60)
Glucose, Bld: 116 mg/dL — ABNORMAL HIGH (ref 70–99)
Potassium: 3.8 mmol/L (ref 3.5–5.1)
Sodium: 138 mmol/L (ref 135–145)

## 2023-11-28 LAB — URINALYSIS, ROUTINE W REFLEX MICROSCOPIC
Bilirubin Urine: NEGATIVE
Glucose, UA: NEGATIVE mg/dL
Hgb urine dipstick: NEGATIVE
Ketones, ur: NEGATIVE mg/dL
Leukocytes,Ua: NEGATIVE
Nitrite: NEGATIVE
Protein, ur: NEGATIVE mg/dL
Specific Gravity, Urine: 1.019 (ref 1.005–1.030)
pH: 6 (ref 5.0–8.0)

## 2023-11-28 LAB — CBC
HCT: 39.1 % (ref 36.0–46.0)
Hemoglobin: 12.6 g/dL (ref 12.0–15.0)
MCH: 29.4 pg (ref 26.0–34.0)
MCHC: 32.2 g/dL (ref 30.0–36.0)
MCV: 91.1 fL (ref 80.0–100.0)
Platelets: 318 10*3/uL (ref 150–400)
RBC: 4.29 MIL/uL (ref 3.87–5.11)
RDW: 13.2 % (ref 11.5–15.5)
WBC: 9.3 10*3/uL (ref 4.0–10.5)
nRBC: 0 % (ref 0.0–0.2)

## 2023-11-28 LAB — CBG MONITORING, ED: Glucose-Capillary: 111 mg/dL — ABNORMAL HIGH (ref 70–99)

## 2023-11-28 NOTE — ED Triage Notes (Signed)
Pt presents with a report of dizziness and chills upon standing that is relieved with sitting and lying down. Pt reports she has been having dental pain x 2 days prior to the episodes of dizziness. Pt denies CP or ShOB. No focal neuro deficits noted in triage.

## 2023-11-29 LAB — HCG, SERUM, QUALITATIVE: Preg, Serum: NEGATIVE

## 2023-11-29 NOTE — ED Provider Notes (Signed)
[place] [facility] AT [facility] Provider Note   CSN: [id number] Arrival date & time: 11/28/23  2250     History  Chief Complaint  Patient presents with   Dizziness    Erika Lang is a 54 y.o. female.  53 yo F here with light headedness. Happened earlier tonight anytime she tries to stands up. Better with rest and better now with time in the waiting room. Asymptomatic during the episodes. No chest pain, palpitaitons, dyspnea, recent illnesses, GI sumptoms GU symptoms. No falls. No etoh,tobacco drugs.    Dizziness      Home Medications Prior to Admission medications   Medication Sig Start Date End Date Taking? Authorizing Provider  clotrimazole (LOTRIMIN) 1 % cream Apply 1 Application topically 2 (two) times daily. 09/06/23   Margaretann Loveless, PA-C  levothyroxine (SYNTHROID) 75 MCG tablet TAKE 1 TABLET(75 MCG) BY MOUTH DAILY 11/01/23   Jeani Sow, MD  neomycin-polymyxin-hydrocortisone (CORTISPORIN) 3.5-10000-1 ophthalmic suspension Place 3 drops into the left eye 4 (four) times daily. 10/02/23   Claiborne Rigg, NP  omeprazole (PRILOSEC) 40 MG capsule Take 1 capsule (40 mg total) by mouth 2 (two) times daily. 02/18/23   Jeani Sow, MD  phentermine (ADIPEX-P) 37.5 MG tablet Take 1 tablet (37.5 mg total) by mouth daily before breakfast. 10/13/22   Jeani Sow, MD  promethazine-dextromethorphan (PROMETHAZINE-DM) 6.25-15 MG/5ML syrup Take 5 mLs by mouth 4 (four) times daily as needed. 01/20/23   Margaretann Loveless, PA-C  terbinafine (LAMISIL) 250 MG tablet Take 1 tablet (250 mg total) by mouth daily. 09/06/23   Margaretann Loveless, PA-C  triamcinolone cream (KENALOG) 0.1 % Apply 1 Application topically 2 (two) times daily. 08/04/22   Jeani Sow, MD      Allergies    Patient has no known allergies.    Review of Systems   Review of Systems  Neurological:  Positive for dizziness.    Physical Exam Updated Vital Signs BP  126/74   Pulse 65   Temp 97.6 F (36.4 C) (Oral)   Resp 14   Ht 5\' 4"  (1.626 m)   Wt 93 kg   SpO2 100%   BMI 35.19 kg/m  Physical Exam Vitals and nursing note reviewed.  Constitutional:      Appearance: She is well-developed.  HENT:     Head: Normocephalic and atraumatic.     Nose: No congestion or rhinorrhea.  Cardiovascular:     Rate and Rhythm: Regular rhythm. Tachycardia present.     Comments: mild Pulmonary:     Effort: No respiratory distress.     Breath sounds: No stridor.  Abdominal:     General: There is no distension.  Musculoskeletal:        General: No swelling or tenderness. Normal range of motion.     Cervical back: Normal range of motion.  Skin:    General: Skin is warm and dry.  Neurological:     Mental Status: She is alert.     ED Results / Procedures / Treatments   Labs (all labs ordered are listed, but only abnormal results are displayed) Labs Reviewed  BASIC METABOLIC PANEL - Abnormal; Notable for the following components:      Result Value   CO2 21 (*)    Glucose, Bld 116 (*)    All other components within normal limits  URINALYSIS, ROUTINE W REFLEX MICROSCOPIC - Abnormal; Notable for the following components:   APPearance HAZY (*)  All other components within normal limits  CBG MONITORING, ED - Abnormal; Notable for the following components:   Glucose-Capillary 111 (*)    All other components within normal limits  CBC  HCG, SERUM, QUALITATIVE    EKG EKG Interpretation Date/Time:  Sunday November 28 2023 23:17:25 EST Ventricular Rate:  91 PR Interval:  150 QRS Duration:  76 QT Interval:  342 QTC Calculation: 420 R Axis:   72  Text Interpretation: Normal sinus rhythm Nonspecific ST and T wave abnormality Abnormal ECG When compared with ECG of 21-May-2020 10:46, PREVIOUS ECG IS PRESENT Confirmed by Marily Memos [id number]) on 11/29/2023 5:31:19 AM  Radiology No results found.  Procedures Procedures    Medications Ordered in  ED Medications - No data to display  ED Course/ Medical Decision Making/ A&P                                 Medical Decision Making Amount and/or Complexity of Data Reviewed Labs: ordered.   Drank quite a bit of water here (approximately 40 oz), urinated multiple times. Ambulated multiple times without recurrent symptoms. Orthostatics ok. Feels well. Will fu w/ pcp if returns or come back if returns and new symptoms.   Final Clinical Impression(s) / ED Diagnoses Final diagnoses:  Dizziness  Dehydration    Rx / DC Orders ED Discharge Orders     None         Henley Boettner, Barbara Cower, MD 11/29/23 [phone]

## 2023-11-30 ENCOUNTER — Telehealth: Payer: Self-pay | Admitting: Physician Assistant

## 2023-11-30 ENCOUNTER — Encounter: Payer: Self-pay | Admitting: Family Medicine

## 2023-11-30 DIAGNOSIS — K047 Periapical abscess without sinus: Secondary | ICD-10-CM

## 2023-11-30 MED ORDER — AMOXICILLIN-POT CLAVULANATE 875-125 MG PO TABS: 1.0000 | ORAL_TABLET | Freq: Two times a day (BID) | ORAL | 0 refills | Status: DC

## 2023-11-30 MED ORDER — NAPROXEN 500 MG PO TABS: 500.0000 mg | ORAL_TABLET | Freq: Two times a day (BID) | ORAL | 0 refills | Status: DC

## 2023-11-30 NOTE — Progress Notes (Signed)
E-Visit for Dental Pain  We are sorry that you are not feeling well.  Here is how we plan to help!  Based on what you have shared with me in the questionnaire, it sounds like you have a possible dental infection. I have prescribed Augmentin 875-125mg  twice a day for 7 days and Naprosyn 500mg  2 times a day for 7 days for discomfort  It is imperative that you see a dentist within 10 days of this eVisit to determine the cause of the dental pain and be sure it is adequately treated  A toothache or tooth pain is caused when the nerve in the root of a tooth or surrounding a tooth is irritated. Dental (tooth) infection, decay, injury, or loss of a tooth are the most common causes of dental pain. Pain may also occur after an extraction (tooth is pulled out). Pain sometimes originates from other areas and radiates to the jaw, thus appearing to be tooth pain.Bacteria growing inside your mouth can contribute to gum disease and dental decay, both of which can cause pain. A toothache occurs from inflammation of the central portion of the tooth called pulp. The pulp contains nerve endings that are very sensitive to pain. Inflammation to the pulp or pulpitis may be caused by dental cavities, trauma, and infection.    HOME CARE:   For toothaches: Over-the-counter pain medications such as acetaminophen or ibuprofen may be used. Take these as directed on the package while you arrange for a dental appointment. Avoid very cold or hot foods, because they may make the pain worse. You may get relief from biting on a cotton ball soaked in oil of cloves. You can get oil of cloves at most drug stores.  For jaw pain:  Aspirin may be helpful for problems in the joint of the jaw in adults. If pain happens every time you open your mouth widely, the temporomandibular joint (TMJ) may be the source of the pain. Yawning or taking a large bite of food may worsen the pain. An appointment with your doctor or dentist will help you  find the cause.     GET HELP RIGHT AWAY IF:  You have a high fever or chills If you have had a recent head or face injury and develop headache, light headedness, nausea, vomiting, or other symptoms that concern you after an injury to your face or mouth, you could have a more serious injury in addition to your dental injury. A facial rash associated with a toothache: This condition may improve with medication. Contact your doctor for them to decide what is appropriate. Any jaw pain occurring with chest pain: Although jaw pain is most commonly caused by dental disease, it is sometimes referred pain from other areas. People with heart disease, especially people who have had stents placed, people with diabetes, or those who have had heart surgery may have jaw pain as a symptom of heart attack or angina. If your jaw or tooth pain is associated with lightheadedness, sweating, or shortness of breath, you should see a doctor as soon as possible. Trouble swallowing or excessive pain or bleeding from gums: If you have a history of a weakened immune system, diabetes, or steroid use, you may be more susceptible to infections. Infections can often be more severe and extensive or caused by unusual organisms. Dental and gum infections in people with these conditions may require more aggressive treatment. An abscess may need draining or IV antibiotics, for example.  MAKE SURE YOU  Understand these instructions. Will watch your condition. Will get help right away if you are not doing well or get worse.  Thank you for choosing an e-visit.  Your e-visit answers were reviewed by a board certified advanced clinical practitioner to complete your personal care plan. Depending upon the condition, your plan could have included both over the counter or prescription medications.  Please review your pharmacy choice. Make sure the pharmacy is open so you can pick up prescription now. If there is a problem, you may contact  your provider through MyChart messaging and have the prescription routed to another pharmacy.  Your safety is important to us. If you have drug allergies check your prescription carefully.   For the next 24 hours you can use MyChart to ask questions about today's visit, request a non-urgent call back, or ask for a work or school excuse. You will get an email in the next two days asking about your experience. I hope that your e-visit has been valuable and will speed your recovery.  

## 2023-11-30 NOTE — Progress Notes (Signed)
I have spent 5 minutes in review of e-visit questionnaire, review and updating patient chart, medical decision making and response to patient.   Mia Milan Cody Jacklynn Dehaas, PA-C    

## 2023-12-01 ENCOUNTER — Telehealth: Payer: Self-pay | Admitting: Physician Assistant

## 2023-12-01 DIAGNOSIS — K0889 Other specified disorders of teeth and supporting structures: Secondary | ICD-10-CM

## 2023-12-01 NOTE — Progress Notes (Signed)
  Because your pain is not well controlled, I feel your condition warrants further evaluation and I recommend that you be seen in a face-to-face visit.   NOTE: There will be NO CHARGE for this E-Visit   If you are having a true medical emergency, please call 911.     For an urgent face to face visit, [place] has multiple urgent care centers for your convenience.  Click the link below for the full list of locations and hours, walk-in wait times, appointment scheduling options and driving directions:  Urgent Care - [place], [place], [place], [place], [place], [place]  [place]     Your MyChart E-visit questionnaire answers were reviewed by a board certified advanced clinical practitioner to complete your personal care plan based on your specific symptoms.    Thank you for using e-Visits.   I have spent 5 minutes in review of e-visit questionnaire, review and updating patient chart, medical decision making and response to patient.   Margaretann Loveless, PA-C

## 2023-12-18 ENCOUNTER — Telehealth: Payer: Self-pay | Admitting: Nurse Practitioner

## 2023-12-18 DIAGNOSIS — K047 Periapical abscess without sinus: Secondary | ICD-10-CM

## 2023-12-18 MED ORDER — PENICILLIN V POTASSIUM 500 MG PO TABS: 500.0000 mg | ORAL_TABLET | Freq: Three times a day (TID) | ORAL | 0 refills | Status: AC

## 2023-12-18 MED ORDER — IBUPROFEN 600 MG PO TABS: 600.0000 mg | ORAL_TABLET | Freq: Three times a day (TID) | ORAL | 0 refills | Status: AC | PRN

## 2023-12-18 NOTE — Progress Notes (Signed)
 E-Visit for Dental Pain  We are sorry that you are not feeling well.  Here is how we plan to help!  Based on what you have shared with me in the questionnaire, I recommend you see the dentist as soon as possible. Antibiotics are not meant to be taken long term and repeatedly.  I have sent the following:  Pen VK 500mg  3 times a day for 7 days and prescription motrin   It is imperative that you see a dentist within 10 days of this eVisit to determine the cause of the dental pain and be sure it is adequately treated  A toothache or tooth pain is caused when the nerve in the root of a tooth or surrounding a tooth is irritated. Dental (tooth) infection, decay, injury, or loss of a tooth are the most common causes of dental pain. Pain may also occur after an extraction (tooth is pulled out). Pain sometimes originates from other areas and radiates to the jaw, thus appearing to be tooth pain.Bacteria growing inside your mouth can contribute to gum disease and dental decay, both of which can cause pain. A toothache occurs from inflammation of the central portion of the tooth called pulp. The pulp contains nerve endings that are very sensitive to pain. Inflammation to the pulp or pulpitis may be caused by dental cavities, trauma, and infection.    HOME CARE:   For toothaches: Over-the-counter pain medications such as acetaminophen or ibuprofen  may be used. Take these as directed on the package while you arrange for a dental appointment. Avoid very cold or hot foods, because they may make the pain worse. You may get relief from biting on a cotton ball soaked in oil of cloves. You can get oil of cloves at most drug stores.  For jaw pain:  Aspirin may be helpful for problems in the joint of the jaw in adults. If pain happens every time you open your mouth widely, the temporomandibular joint (TMJ) may be the source of the pain. Yawning or taking a large bite of food may worsen the pain. An appointment with  your doctor or dentist will help you find the cause.     GET HELP RIGHT AWAY IF:  You have a high fever or chills If you have had a recent head or face injury and develop headache, light headedness, nausea, vomiting, or other symptoms that concern you after an injury to your face or mouth, you could have a more serious injury in addition to your dental injury. A facial rash associated with a toothache: This condition may improve with medication. Contact your doctor for them to decide what is appropriate. Any jaw pain occurring with chest pain: Although jaw pain is most commonly caused by dental disease, it is sometimes referred pain from other areas. People with heart disease, especially people who have had stents placed, people with diabetes, or those who have had heart surgery may have jaw pain as a symptom of heart attack or angina. If your jaw or tooth pain is associated with lightheadedness, sweating, or shortness of breath, you should see a doctor as soon as possible. Trouble swallowing or excessive pain or bleeding from gums: If you have a history of a weakened immune system, diabetes, or steroid use, you may be more susceptible to infections. Infections can often be more severe and extensive or caused by unusual organisms. Dental and gum infections in people with these conditions may require more aggressive treatment. An abscess may need draining or IV  antibiotics, for example.  MAKE SURE YOU   Understand these instructions. Will watch your condition. Will get help right away if you are not doing well or get worse.  Thank you for choosing an e-visit.  Your e-visit answers were reviewed by a board certified advanced clinical practitioner to complete your personal care plan. Depending upon the condition, your plan could have included both over the counter or prescription medications.  Please review your pharmacy choice. Make sure the pharmacy is open so you can pick up prescription now.  If there is a problem, you may contact your provider through [employer] and have the prescription routed to another pharmacy.  Your safety is important to us . If you have drug allergies check your prescription carefully.   For the next 24 hours you can use MyChart to ask questions about today's visit, request a non-urgent call back, or ask for a work or school excuse. You will get an email in the next two days asking about your experience. I hope that your e-visit has been valuable and will speed your recovery.

## 2023-12-18 NOTE — Progress Notes (Signed)
 I have spent 5 minutes in review of e-visit questionnaire, review and updating patient chart, medical decision making and response to patient.   Claiborne Rigg, NP

## 2024-01-11 ENCOUNTER — Telehealth: Payer: Self-pay

## 2024-01-11 DIAGNOSIS — K047 Periapical abscess without sinus: Secondary | ICD-10-CM

## 2024-01-12 NOTE — Progress Notes (Signed)
  Because of ongoing pain and treatment via e-visit/video visit within past month for same issue, I feel your condition warrants further evaluation and I recommend that you be seen in a face-to-face visit.   NOTE: There will be NO CHARGE for this E-Visit   If you are having a true medical emergency, please call 911.     For an urgent face to face visit, [place] has multiple urgent care centers for your convenience.  Click the link below for the full list of locations and hours, walk-in wait times, appointment scheduling options and driving directions:  Urgent Care - [place], [place], [place], [place], [place], [place]  [place]     Your MyChart E-visit questionnaire answers were reviewed by a board certified advanced clinical practitioner to complete your personal care plan based on your specific symptoms.    Thank you for using e-Visits.

## 2024-02-11 ENCOUNTER — Telehealth: Payer: Self-pay | Admitting: Physician Assistant

## 2024-02-11 DIAGNOSIS — R3989 Other symptoms and signs involving the genitourinary system: Secondary | ICD-10-CM

## 2024-02-11 MED ORDER — CEPHALEXIN 500 MG PO CAPS: 500.0000 mg | ORAL_CAPSULE | Freq: Two times a day (BID) | ORAL | 0 refills | Status: DC

## 2024-02-11 NOTE — Progress Notes (Signed)

## 2024-05-31 ENCOUNTER — Telehealth: Payer: Self-pay | Admitting: Physician Assistant

## 2024-05-31 DIAGNOSIS — R3989 Other symptoms and signs involving the genitourinary system: Secondary | ICD-10-CM

## 2024-05-31 MED ORDER — CEPHALEXIN 500 MG PO CAPS: 500.0000 mg | ORAL_CAPSULE | Freq: Two times a day (BID) | ORAL | 0 refills | Status: AC

## 2024-05-31 NOTE — Progress Notes (Signed)
 I have spent 5 minutes in review of e-visit questionnaire, review and updating patient chart, medical decision making and response to patient.   Piedad Climes, PA-C

## 2024-05-31 NOTE — Progress Notes (Signed)

## 2024-06-16 ENCOUNTER — Other Ambulatory Visit: Payer: Self-pay | Admitting: Family

## 2024-06-16 ENCOUNTER — Other Ambulatory Visit: Payer: Self-pay | Admitting: Family Medicine

## 2024-06-16 ENCOUNTER — Encounter: Payer: Self-pay | Admitting: Family Medicine

## 2024-06-16 ENCOUNTER — Other Ambulatory Visit: Payer: Self-pay

## 2024-06-16 DIAGNOSIS — E039 Hypothyroidism, unspecified: Secondary | ICD-10-CM

## 2024-06-16 MED ORDER — LEVOTHYROXINE SODIUM 75 MCG PO TABS: ORAL_TABLET | ORAL | 0 refills | Status: DC

## 2024-06-16 MED ORDER — LEVOTHYROXINE SODIUM 75 MCG PO TABS: ORAL_TABLET | ORAL | 1 refills | Status: DC

## 2024-06-16 NOTE — Telephone Encounter (Signed)
 Last OV 02/16/23 Next OV not scheduled  Last refill 06/16/24 Qty #30/1  Pt has not been seen in > 1 year, needs f/u OV, please reach out to assist with scheduling.

## 2024-07-14 ENCOUNTER — Other Ambulatory Visit: Payer: Self-pay | Admitting: *Deleted

## 2024-07-14 DIAGNOSIS — E039 Hypothyroidism, unspecified: Secondary | ICD-10-CM

## 2024-07-14 MED ORDER — LEVOTHYROXINE SODIUM 75 MCG PO TABS: ORAL_TABLET | ORAL | 0 refills | Status: DC

## 2024-07-16 ENCOUNTER — Other Ambulatory Visit: Payer: Self-pay | Admitting: Family Medicine

## 2024-07-16 DIAGNOSIS — E039 Hypothyroidism, unspecified: Secondary | ICD-10-CM

## 2024-07-17 ENCOUNTER — Telehealth: Payer: Self-pay | Admitting: Physician Assistant

## 2024-07-17 ENCOUNTER — Other Ambulatory Visit: Payer: Self-pay | Admitting: *Deleted

## 2024-07-17 ENCOUNTER — Other Ambulatory Visit: Payer: Self-pay | Admitting: Family Medicine

## 2024-07-17 DIAGNOSIS — E039 Hypothyroidism, unspecified: Secondary | ICD-10-CM

## 2024-07-17 DIAGNOSIS — R3989 Other symptoms and signs involving the genitourinary system: Secondary | ICD-10-CM

## 2024-07-17 MED ORDER — LEVOTHYROXINE SODIUM 75 MCG PO TABS: ORAL_TABLET | ORAL | 0 refills | Status: DC

## 2024-07-18 MED ORDER — CEPHALEXIN 500 MG PO CAPS: 500.0000 mg | ORAL_CAPSULE | Freq: Two times a day (BID) | ORAL | 0 refills | Status: AC

## 2024-07-18 NOTE — Progress Notes (Signed)
 I have spent 5 minutes in review of e-visit questionnaire, review and updating patient chart, medical decision making and response to patient.   Elsie Velma Lunger, PA-C

## 2024-07-18 NOTE — Progress Notes (Signed)

## 2024-08-01 ENCOUNTER — Ambulatory Visit: Payer: Self-pay | Admitting: Family Medicine

## 2024-08-08 ENCOUNTER — Ambulatory Visit: Payer: Self-pay | Admitting: Family Medicine

## 2024-08-09 ENCOUNTER — Ambulatory Visit: Payer: Self-pay | Admitting: Family Medicine

## 2024-08-13 ENCOUNTER — Other Ambulatory Visit: Payer: Self-pay | Admitting: Family Medicine

## 2024-08-13 DIAGNOSIS — E039 Hypothyroidism, unspecified: Secondary | ICD-10-CM

## 2024-08-29 ENCOUNTER — Ambulatory Visit: Payer: Self-pay | Admitting: Family Medicine

## 2024-09-26 ENCOUNTER — Ambulatory Visit: Payer: Self-pay | Admitting: Family Medicine

## 2024-10-13 ENCOUNTER — Telehealth: Payer: Self-pay | Admitting: Family Medicine

## 2024-10-13 DIAGNOSIS — N3000 Acute cystitis without hematuria: Secondary | ICD-10-CM

## 2024-10-13 MED ORDER — CEPHALEXIN 500 MG PO CAPS: 500.0000 mg | ORAL_CAPSULE | Freq: Two times a day (BID) | ORAL | 0 refills | Status: AC

## 2024-10-13 NOTE — Progress Notes (Signed)

## 2024-11-05 ENCOUNTER — Telehealth: Payer: Self-pay | Admitting: Physician Assistant

## 2024-11-05 DIAGNOSIS — H6993 Unspecified Eustachian tube disorder, bilateral: Secondary | ICD-10-CM

## 2024-11-05 MED ORDER — FLUTICASONE PROPIONATE 50 MCG/ACT NA SUSP: 2.0000 | Freq: Every day | NASAL | 0 refills | Status: AC

## 2024-11-05 NOTE — Progress Notes (Signed)

## 2024-12-08 ENCOUNTER — Telehealth: Payer: Self-pay | Admitting: Nurse Practitioner

## 2024-12-08 DIAGNOSIS — R3989 Other symptoms and signs involving the genitourinary system: Secondary | ICD-10-CM

## 2024-12-08 MED ORDER — CEPHALEXIN 500 MG PO CAPS: 500.0000 mg | ORAL_CAPSULE | Freq: Two times a day (BID) | ORAL | 0 refills | Status: AC

## 2024-12-08 NOTE — Progress Notes (Signed)
